# Patient Record
Sex: Male | Born: 1965 | Race: White | Hispanic: No | Marital: Married | Smoking: Former smoker
Health system: Southern US, Community
[De-identification: ages and names within clinical notes are randomized; demographics above are authoritative.]

## PROBLEM LIST (undated history)

## (undated) DIAGNOSIS — Z9889 Other specified postprocedural states: Secondary | ICD-10-CM

## (undated) DIAGNOSIS — M1990 Unspecified osteoarthritis, unspecified site: Secondary | ICD-10-CM

## (undated) DIAGNOSIS — R112 Nausea with vomiting, unspecified: Secondary | ICD-10-CM

## (undated) DIAGNOSIS — F419 Anxiety disorder, unspecified: Secondary | ICD-10-CM

## (undated) DIAGNOSIS — K219 Gastro-esophageal reflux disease without esophagitis: Secondary | ICD-10-CM

## (undated) DIAGNOSIS — F329 Major depressive disorder, single episode, unspecified: Secondary | ICD-10-CM

## (undated) DIAGNOSIS — F32A Depression, unspecified: Secondary | ICD-10-CM

## (undated) DIAGNOSIS — I10 Essential (primary) hypertension: Secondary | ICD-10-CM

## (undated) HISTORY — PX: NECK SURGERY: SHX720

## (undated) HISTORY — PX: HAND SURGERY: SHX662

## (undated) HISTORY — PX: BACK SURGERY: SHX140

---

## 2003-08-04 ENCOUNTER — Emergency Department (HOSPITAL_COMMUNITY): Admission: EM | Admit: 2003-08-04 | Discharge: 2003-08-05 | Payer: Self-pay | Admitting: *Deleted

## 2006-03-02 ENCOUNTER — Ambulatory Visit (HOSPITAL_COMMUNITY): Admission: RE | Admit: 2006-03-02 | Discharge: 2006-03-03 | Payer: Self-pay | Admitting: Neurological Surgery

## 2016-03-04 ENCOUNTER — Encounter (HOSPITAL_COMMUNITY): Payer: Self-pay | Admitting: Emergency Medicine

## 2016-03-04 ENCOUNTER — Emergency Department (HOSPITAL_COMMUNITY): Payer: Medicare Other

## 2016-03-04 ENCOUNTER — Emergency Department (HOSPITAL_COMMUNITY)
Admission: EM | Admit: 2016-03-04 | Discharge: 2016-03-04 | Disposition: A | Discharge status: Home / Self Care | Payer: Medicare Other | Attending: Emergency Medicine | Admitting: Emergency Medicine

## 2016-03-04 DIAGNOSIS — Z79899 Other long term (current) drug therapy: Secondary | ICD-10-CM | POA: Diagnosis not present

## 2016-03-04 DIAGNOSIS — G8929 Other chronic pain: Secondary | ICD-10-CM | POA: Diagnosis not present

## 2016-03-04 DIAGNOSIS — M545 Low back pain: Secondary | ICD-10-CM | POA: Insufficient documentation

## 2016-03-04 DIAGNOSIS — M549 Dorsalgia, unspecified: Secondary | ICD-10-CM

## 2016-03-04 MED ORDER — FENTANYL CITRATE (PF) 100 MCG/2ML IJ SOLN
100.0000 ug | Freq: Once | INTRAMUSCULAR | Status: AC
  Administered 2016-03-04: 100 ug via INTRAMUSCULAR
  Filled 2016-03-04: qty 2

## 2016-03-04 MED ORDER — METHOCARBAMOL 500 MG PO TABS: 1000.0000 mg | ORAL_TABLET | Freq: Four times a day (QID) | ORAL | Status: DC | PRN

## 2016-03-04 MED ORDER — PREDNISONE 20 MG PO TABS: 40.0000 mg | ORAL_TABLET | Freq: Every day | ORAL | Status: DC

## 2016-03-04 MED ORDER — METHOCARBAMOL 500 MG PO TABS
1000.0000 mg | ORAL_TABLET | Freq: Once | ORAL | Status: AC
  Administered 2016-03-04: 1000 mg via ORAL
  Filled 2016-03-04: qty 2

## 2016-03-04 NOTE — ED Notes (Signed)
Pt states that he was seen at [facility] ED for same symptoms on Wednesday.  Pt states there were no x-rays or scans taken and is concerned that "something is wrong".

## 2016-03-04 NOTE — ED Provider Notes (Signed)
CSN: [id number]     Arrival date & time 03/04/16  1439 History   First MD Initiated Contact with Patient 03/04/16 1507     Chief Complaint  Patient presents with  . Back Pain      HPI  Pt was seen at 1510. Per pt, c/o gradual onset and persistence of constant acute flair of his chronic low back "pain" for the past several months, worse over the past 2+ weeks. Describes his LBP as "shooting" down his left leg. Denies any change in his usual chronic pain pattern.  Pain worsens with palpation of the area and body position changes. States he has been evaluated by his PMD x2 as well as ED in [place] for this complaint and "no one is doing anything." States he was given IM toradol and PO oxycodone and valium without change in his symptoms. Endorses Neurosurgical MD appt next week. Denies incont/retention of bowel or bladder, no saddle anesthesia, no focal motor weakness, no tingling/numbness in extremities, no fevers, no injury, no abd pain.  The symptoms have been associated with no other complaints. The patient has a significant history of similar symptoms previously.     History reviewed. No pertinent past medical history.   Past Surgical History  Procedure Laterality Date  . Back surgery    . Neck surgery    . Hand surgery      Social History  Substance Use Topics  . Smoking status: Never Smoker   . Smokeless tobacco: Current User  . Alcohol Use: No    Review of Systems ROS: Statement: All systems negative except as marked or noted in the HPI; Constitutional: Negative for fever and chills. ; ; Eyes: Negative for eye pain, redness and discharge. ; ; ENMT: Negative for ear pain, hoarseness, nasal congestion, sinus pressure and sore throat. ; ; Cardiovascular: Negative for chest pain, palpitations, diaphoresis, dyspnea and peripheral edema. ; ; Respiratory: Negative for cough, wheezing and stridor. ; ; Gastrointestinal: Negative for nausea, vomiting, diarrhea, abdominal pain, blood in  stool, hematemesis, jaundice and rectal bleeding. . ; ; Genitourinary: Negative for dysuria, flank pain and hematuria. ; ; Musculoskeletal: +LBP. Negative for neck pain. Negative for swelling and trauma.; ; Skin: Negative for pruritus, rash, abrasions, blisters, bruising and skin lesion.; ; Neuro: Negative for headache, lightheadedness and neck stiffness. Negative for weakness, altered level of consciousness, altered mental status, extremity weakness, paresthesias, involuntary movement, seizure and syncope.      Allergies  Review of patient's allergies indicates no known allergies.  Home Medications   Prior to Admission medications   Medication Sig Start Date End Date Taking? Authorizing Provider  diazepam (VALIUM) 5 MG tablet Take 5 mg by mouth 2 (two) times daily.   Yes Historical Provider, MD  methocarbamol (ROBAXIN) 500 MG tablet Take 500 mg by mouth every 12 (twelve) hours as needed for muscle spasms.   Yes Historical Provider, MD  omeprazole (PRILOSEC) 20 MG capsule Take 40 mg by mouth daily.   Yes Historical Provider, MD  Oxycodone HCl 10 MG TABS Take 10 mg by mouth 3 (three) times daily.   Yes Historical Provider, MD   BP 159/88 mmHg  Pulse 96  Temp(Src) 98.2 F (36.8 C) (Oral)  Resp 18  Ht  (1.753 m)  Wt 194 lb (87.998 kg)  BMI 28.64 kg/m2  SpO2 100% Physical Exam  1515: Physical examination:  Nursing notes reviewed; Vital signs and O2 SAT reviewed;  Constitutional: Well developed, Well nourished, Well hydrated, In  no acute distress; Head:  Normocephalic, atraumatic; Eyes: EOMI, PERRL, No scleral icterus; ENMT: Mouth and pharynx normal, Mucous membranes moist; Neck: Supple, Full range of motion, No lymphadenopathy; Cardiovascular: Regular rate and rhythm, No gallop; Respiratory: Breath sounds clear & equal bilaterally, No wheezes.  Speaking full sentences with ease, Normal respiratory effort/excursion; Chest: Nontender, Movement normal; Abdomen: Soft, Nontender, Nondistended,  Normal bowel sounds; Genitourinary: No CVA tenderness; Spine:  No midline CS, TS, LS tenderness. +TTP left lumbar paraspinal muscles.;; Extremities: Pulses normal, No tenderness, No edema, No calf edema or asymmetry.; Neuro: AA&Ox3, Major CN grossly intact.  Speech clear. No gross focal motor or sensory deficits in extremities. Strength 5/5 equal bilat UE's and LE's, including great toe dorsiflexion.  DTR 2/4 equal bilat UE's and LE's.  No gross sensory deficits.  Neg straight leg raises bilat.; Skin: Color normal, Warm, Dry.   ED Course  Procedures (including critical care time) Labs Review  Imaging Review  I have personally reviewed and evaluated these images and lab results as part of my medical decision-making.   EKG Interpretation None      MDM  MDM Reviewed: previous chart, nursing note and vitals Interpretation: CT scan     Ct Lumbar Spine Wo Contrast 03/04/2016  CLINICAL DATA:  Low back pain extending into the left lower extremity. Personal history of lumbar spine surgery 2012. EXAM: CT LUMBAR SPINE WITHOUT CONTRAST TECHNIQUE: Multidetector CT imaging of the lumbar spine was performed without intravenous contrast administration. Multiplanar CT image reconstructions were also generated. COMPARISON:  None. FINDINGS: Lumbar spine is imaged from the midbody of T12 through the midbody of S3. Superior endplate Schmorl's nodes present at L3 and L4. Vertebral body heights are otherwise maintained. PLIF is present at L5-S1. There is solid anterior and posterior fusion. Pedicle screw and rod fixation is in place. Grade 1 anterolisthesis is present at the adjacent level L4-5 measuring 4 mm. AP alignment is otherwise anatomic. Mild leftward curvature of the lumbar spine is centered at L3-4. Limited imaging of the abdomen is unremarkable. There is no significant adenopathy. T12-L1: Mild facet hypertrophy is present bilaterally without significant stenosis. L1-2: Lateral disc bulging and endplate  changes are present. Mild facet hypertrophy is noted bilaterally. There is no significant stenosis. L2-3: A right paramedian disc protrusion extends into the right subarticular space with moderate stenosis. The left central canal is patent. The foramina are patent bilaterally. Facet hypertrophy contributes. L3-4: A mild broad-based disc protrusion and bilateral facet hypertrophy is present without significant stenosis. L4-5: There is uncovering of a broad-based disc protrusion associated with 4 mm anterolisthesis. Advanced facet hypertrophy is noted bilaterally. This results in moderate subarticular narrowing bilaterally, left greater than right. Moderate left and mild right foraminal stenosis is present. L5-S1: A wide laminectomy is present. The central canal and foramina are patent bilaterally. IMPRESSION: 1. Solid fusion at L5-S1 without residual or recurrent stenosis. 2. Adjacent level disease with advanced facet hypertrophy and 4 mm anterolisthesis. There is uncovering of a broad-based disc protrusion with moderate subarticular stenosis, left greater than right. 3. Moderate left and mild right foraminal stenosis at L4-5. 4. Right paramedian disc protrusion at L2-3 with moderate right subarticular stenosis. 5. Spondylosis at L1-2 and L3-4 without significant stenosis at these levels. Electronically Signed   By: Marin Robertshristopher  Mattern M.D.   On: 03/04/2016 16:35    1710:  CT scan reassuring regarding no acute surgical process. [facility] Dr. Wynetta Emeryram, case discussed, including:  HPI, pertinent PM/SHx, VS/PE, dx testing,  ED course and treatment:  He has viewed the images, agrees no acute surgical process at this time, multiple chronic changes, tx symptomatically, f/u with [place] in [place]. Dx and testing, as well as d/w Neurosurgeon, d/w pt and family.  Questions answered.  Verb understanding, agreeable to d/c home with outpt f/u.   Samuel JesterKathleen Hershal Eriksson, DO 03/06/16 1504

## 2016-03-04 NOTE — Discharge Instructions (Signed)
Take the prescriptions as directed.  Apply moist heat or ice to the area(s) of discomfort, for 15 minutes at a time, several times per day for the next few days.  Do not fall asleep on a heating or ice pack.  Call your regular medical doctor and your Neurosurgeon on Monday to schedule a follow up appointment next week.  Return to the Emergency Department immediately if worsening.

## 2016-03-04 NOTE — ED Notes (Signed)
History of back surgeries.  Bent out about two weeks ago and started with back pain.  Rates pain 7/10.

## 2016-10-16 ENCOUNTER — Emergency Department (HOSPITAL_COMMUNITY): Payer: Medicare Other

## 2016-10-16 ENCOUNTER — Emergency Department (HOSPITAL_COMMUNITY)
Admission: EM | Admit: 2016-10-16 | Discharge: 2016-10-16 | Disposition: A | Discharge status: Home / Self Care | Payer: Medicare Other | Attending: Emergency Medicine | Admitting: Emergency Medicine

## 2016-10-16 ENCOUNTER — Encounter (HOSPITAL_COMMUNITY): Payer: Self-pay | Admitting: Emergency Medicine

## 2016-10-16 DIAGNOSIS — R1010 Upper abdominal pain, unspecified: Secondary | ICD-10-CM | POA: Insufficient documentation

## 2016-10-16 DIAGNOSIS — F17220 Nicotine dependence, chewing tobacco, uncomplicated: Secondary | ICD-10-CM | POA: Diagnosis not present

## 2016-10-16 DIAGNOSIS — R112 Nausea with vomiting, unspecified: Secondary | ICD-10-CM | POA: Insufficient documentation

## 2016-10-16 DIAGNOSIS — R6883 Chills (without fever): Secondary | ICD-10-CM | POA: Insufficient documentation

## 2016-10-16 DIAGNOSIS — R197 Diarrhea, unspecified: Secondary | ICD-10-CM | POA: Diagnosis not present

## 2016-10-16 DIAGNOSIS — Z79899 Other long term (current) drug therapy: Secondary | ICD-10-CM | POA: Diagnosis not present

## 2016-10-16 LAB — COMPREHENSIVE METABOLIC PANEL
ALBUMIN: 4.9 g/dL (ref 3.5–5.0)
ALK PHOS: 66 U/L (ref 38–126)
ALT: 32 U/L (ref 17–63)
AST: 32 U/L (ref 15–41)
Anion gap: 7 (ref 5–15)
BILIRUBIN TOTAL: 0.6 mg/dL (ref 0.3–1.2)
BUN: 14 mg/dL (ref 6–20)
CALCIUM: 10.2 mg/dL (ref 8.9–10.3)
CO2: 28 mmol/L (ref 22–32)
CREATININE: 0.98 mg/dL (ref 0.61–1.24)
Chloride: 101 mmol/L (ref 101–111)
GFR calc Af Amer: >60 mL/min (ref >60)
GFR calc non Af Amer: >60 mL/min (ref >60)
GLUCOSE: 103 mg/dL — AB (ref 65–99)
POTASSIUM: 3.6 mmol/L (ref 3.5–5.1)
Sodium: 136 mmol/L (ref 135–145)
TOTAL PROTEIN: 8.3 g/dL — AB (ref 6.5–8.1)

## 2016-10-16 LAB — CBC
HEMATOCRIT: 45.7 % (ref 39.0–52.0)
Hemoglobin: 15.9 g/dL (ref 13.0–17.0)
MCH: 30.3 pg (ref 26.0–34.0)
MCHC: 34.8 g/dL (ref 30.0–36.0)
MCV: 87.2 fL (ref 78.0–100.0)
PLATELETS: 251 10*3/uL (ref 150–400)
RBC: 5.24 MIL/uL (ref 4.22–5.81)
RDW: 13 % (ref 11.5–15.5)
WBC: 8.5 10*3/uL (ref 4.0–10.5)

## 2016-10-16 LAB — URINALYSIS, ROUTINE W REFLEX MICROSCOPIC
BACTERIA UA: NONE SEEN
Bilirubin Urine: NEGATIVE
GLUCOSE, UA: NEGATIVE mg/dL
KETONES UR: NEGATIVE mg/dL
Leukocytes, UA: NEGATIVE
NITRITE: NEGATIVE
PH: 6 (ref 5.0–8.0)
Protein, ur: NEGATIVE mg/dL
Specific Gravity, Urine: 1.006 (ref 1.005–1.030)

## 2016-10-16 LAB — LIPASE, BLOOD: Lipase: 14 U/L (ref 11–51)

## 2016-10-16 MED ORDER — SODIUM CHLORIDE 0.9 % IV BOLUS (SEPSIS)
1000.0000 mL | Freq: Once | INTRAVENOUS | Status: AC
  Administered 2016-10-16: 1000 mL via INTRAVENOUS

## 2016-10-16 MED ORDER — IOPAMIDOL (ISOVUE-300) INJECTION 61%
100.0000 mL | Freq: Once | INTRAVENOUS | Status: AC | PRN
  Administered 2016-10-16: 100 mL via INTRAVENOUS

## 2016-10-16 MED ORDER — ONDANSETRON HCL 4 MG/2ML IJ SOLN
4.0000 mg | Freq: Once | INTRAMUSCULAR | Status: AC
Start: 2016-10-16 — End: 2016-10-16
  Administered 2016-10-16: 4 mg via INTRAVENOUS
  Filled 2016-10-16: qty 2

## 2016-10-16 MED ORDER — IOPAMIDOL (ISOVUE-300) INJECTION 61%
30.0000 mL | Freq: Once | INTRAVENOUS | Status: AC | PRN
  Administered 2016-10-16: 30 mL via ORAL

## 2016-10-16 NOTE — ED Notes (Signed)
Pt reports he is currently being tx for a "testicle infection" for several weeks. Recent antibx change, now having abd pain and D/. D/ x 4 today, denies V/, has been taking prescription medication for N/.

## 2016-10-16 NOTE — ED Triage Notes (Signed)
Patient complains of nausea, vomiting and diarrhea that started 2 weeks ago. States abdominal pain center lower abdomin.

## 2016-10-16 NOTE — ED Provider Notes (Signed)
[facility] DEPT Provider Note   CSN: [id number] Arrival date & time: 10/16/16  4098   By signing my name below, I, Bobbie Stack, attest that this documentation has been prepared under the direction and in the presence of Benjiman Core, MD. Electronically Signed: Bobbie Stack, Scribe. 10/16/16. 12:02 PM. History   Chief Complaint Chief Complaint  Patient presents with  . Nausea  . Emesis  . Diarrhea    The history is provided by the patient. No language interpreter was used.   HPI Comments: Dakota Snow is a 51 y.o. male who presents to the Emergency Department complaining of worsening upper abdominal pain that began around 2 weeks ago. He also reports associated diarrhea and chills. He reports diarrhea x4 today. He reports blood in stool 3 days in a row starting 10/11/2016. His first stool was noted to be normal with blood noted. His second stool was watery with blood noted. The patient also states that his appetite has been decreased recently. He states that his pain is sometimes worsened by food and is sometimes improved by food. He denies any fevers. The patient states that he has been on antibiotics for the past 2 weeks for a testicular infection. He was first placed on Cipro and was then switched to Levaquin by his Urologist. He has had similar infections in the past.   History reviewed. No pertinent past medical history.  There are no active problems to display for this patient.   Past Surgical History:  Procedure Laterality Date  . BACK SURGERY    . HAND SURGERY    . NECK SURGERY         Home Medications    Prior to Admission medications   Medication Sig Start Date End Date Taking? Authorizing Provider  diazepam (VALIUM) 5 MG tablet Take 5 mg by mouth 2 (two) times daily.   Yes Historical Provider, MD  levofloxacin (LEVAQUIN) 500 MG tablet Take 500 mg by mouth daily.  10/12/16  Yes Historical Provider, MD  omeprazole (PRILOSEC) 20 MG capsule Take 40 mg  by mouth daily.   Yes Historical Provider, MD  Oxycodone HCl 10 MG TABS Take 10 mg by mouth 3 (three) times daily as needed (pain).    Yes Historical Provider, MD  promethazine (PHENERGAN) 25 MG tablet Take 25 mg by mouth every 6 (six) hours as needed.  10/12/16  Yes Historical Provider, MD    Family History No family history on file.  Social History Social History  Substance Use Topics  . Smoking status: Never Smoker  . Smokeless tobacco: Current User    Types: Chew  . Alcohol use No     Allergies   Patient has no known allergies.   Review of Systems Review of Systems  Constitutional: Positive for appetite change (Decreased) and chills. Negative for fever.  Gastrointestinal: Positive for abdominal pain and diarrhea.  All other systems reviewed and are negative.    Physical Exam Updated Vital Signs BP 145/81   Pulse 77   Temp 98.1 F (36.7 C) (Oral)   Resp 17   Ht 5\' 8"  (1.727 m)   Wt 192 lb (87.1 kg)   SpO2 100%   BMI 29.19 kg/m   Physical Exam  Constitutional: He is oriented to person, place, and time. He appears well-developed and well-nourished. No distress.  HENT:  Head: Normocephalic and atraumatic.  Right Ear: External ear normal.  Left Ear: External ear normal.  Nose: Nose normal.  Mouth/Throat: Oropharynx is clear and  moist. No oropharyngeal exudate.  Eyes: Conjunctivae and EOM are normal. Pupils are equal, round, and reactive to light.  Neck: Normal range of motion. Neck supple.  Cardiovascular: Normal rate and regular rhythm.   Pulmonary/Chest: Effort normal and breath sounds normal. No stridor. No respiratory distress.  Lungs clear.  Abdominal: There is tenderness. There is no rebound and no guarding.  Mild upper quadrant tenderness. No rebounds and guarding.  Musculoskeletal: He exhibits no edema.  Neurological: He is alert and oriented to person, place, and time. No cranial nerve deficit.  Skin: Skin is warm. No rash noted. He is not  diaphoretic. No erythema.  Nursing note and vitals reviewed.    ED Treatments / Results  DIAGNOSTIC STUDIES: Oxygen Saturation is 100% on RA, normal by my interpretation.  COORDINATION OF CARE: 11:35 AM Discussed treatment plan with pt at bedside, which includes CT a/p and labs, and pt agreed to plan.  Labs (all labs ordered are listed, but only abnormal results are displayed) Labs Reviewed  COMPREHENSIVE METABOLIC PANEL - Abnormal; Notable for the following:       Result Value   Glucose, Bld 103 (*)    Total Protein 8.3 (*)    All other components within normal limits  URINALYSIS, ROUTINE W REFLEX MICROSCOPIC - Abnormal; Notable for the following:    Color, Urine STRAW (*)    Hgb urine dipstick MODERATE (*)    All other components within normal limits  LIPASE, BLOOD  CBC    EKG  EKG Interpretation None       Radiology Ct Abdomen Pelvis W Contrast  Result Date: 10/16/2016 CLINICAL DATA:  Left lower quadrant pain, nausea, vomiting, diarrhea EXAM: CT ABDOMEN AND PELVIS WITH CONTRAST TECHNIQUE: Multidetector CT imaging of the abdomen and pelvis was performed using the standard protocol following bolus administration of intravenous contrast. CONTRAST:  30mL ISOVUE-300 IOPAMIDOL (ISOVUE-300) INJECTION 61%, ISOVUE-300 IOPAMIDOL (ISOVUE-300) INJECTION 61% COMPARISON:  None. FINDINGS: Lower chest: No acute abnormality. Hepatobiliary: No focal liver abnormality is seen. No gallstones, gallbladder wall thickening, or biliary dilatation. Pancreas: Unremarkable. No pancreatic ductal dilatation or surrounding inflammatory changes. Mild pancreatic atrophy. Spleen: Normal in size without focal abnormality. Adrenals/Urinary Tract: Adrenal glands are unremarkable. Kidneys are normal, without renal calculi, focal lesion, or hydronephrosis. Bladder is unremarkable. Stomach/Bowel: Stomach is within normal limits. Appendix appears normal. No evidence of bowel wall thickening, distention, or  inflammatory changes. No pneumatosis, pneumoperitoneum or portal venous gas. Vascular/Lymphatic: No significant vascular findings are present. No enlarged abdominal or pelvic lymph nodes. Reproductive: Prostate is unremarkable. Other: No abdominal wall hernia or abnormality. No abdominopelvic ascites. Musculoskeletal: No acute osseous abnormality. No lytic or sclerotic osseous lesion. Posterior spinal interbody fusion at L5-S1. Grade 1 anterolisthesis of L4 on L5 secondary to severe bilateral facet arthropathy. IMPRESSION: 1. No acute abdominal or pelvic pathology. Electronically Signed   By: Elige Ko   On: 10/16/2016 13:23    Procedures Procedures (including critical care time)  Medications Ordered in ED Medications  sodium chloride 0.9 % bolus 1,000 mL (1,000 mLs Intravenous New Bag/Given 10/16/16 1152)  ondansetron (ZOFRAN) injection 4 mg (4 mg Intravenous Given 10/16/16 1152)  iopamidol (ISOVUE-300) 61 % injection 100 mL (100 mLs Intravenous Contrast Given 10/16/16 1302)  iopamidol (ISOVUE-300) 61 % injection 30 mL (30 mLs Oral Contrast Given 10/16/16 1302)     Initial Impression / Assessment and Plan / ED Course  I have reviewed the triage vital signs and the nursing notes.  Pertinent labs &  imaging results that were available during my care of the patient were reviewed by me and considered in my medical decision making (see chart for details).     Patient presents with nausea vomiting diarrhea and abdominal pain. Has been going for a couple weeks. Also lower abdominal pain. Is being treated with Levaquin for epididymitis and prostatitis. Reportedly has been having numerous issues with his belly. Has seen GI and hasn't seen urology. Reportedly wanted to CT scan.No dysuria. Has had GI issues for a while. Patient's wife later came and is questioning the diagnosis of prostatitis. Would like to see different urologist. She thinks also this may have to do with the son's suicide a year ago that  he saw. She seems very anxious. Will have follow-up with his gastroenterologist and given information for a Lyme serology in [place]. Will need to see his primary care doctor to help arrange further follow-up. Discharge home.  Final Clinical Impressions(s) / ED Diagnoses   Final diagnoses:  Nausea vomiting and diarrhea    New Prescriptions New Prescriptions   No medications on file   I personally performed the services described in this documentation, which was scribed in my presence. The recorded information has been reviewed and is accurate.      Benjiman CoreNathan Kinjal Neitzke, MD 10/16/16 [phone]

## 2017-05-04 ENCOUNTER — Other Ambulatory Visit (HOSPITAL_COMMUNITY): Payer: Self-pay | Admitting: Neurological Surgery

## 2017-05-04 ENCOUNTER — Other Ambulatory Visit: Payer: Self-pay | Admitting: Neurological Surgery

## 2017-05-04 DIAGNOSIS — M4316 Spondylolisthesis, lumbar region: Secondary | ICD-10-CM

## 2017-05-24 ENCOUNTER — Ambulatory Visit (HOSPITAL_COMMUNITY)
Admission: RE | Admit: 2017-05-24 | Discharge: 2017-05-24 | Disposition: A | Discharge status: Home / Self Care | Payer: Medicare Other | Source: Ambulatory Visit | Attending: Neurological Surgery | Admitting: Neurological Surgery

## 2017-05-24 DIAGNOSIS — M4316 Spondylolisthesis, lumbar region: Secondary | ICD-10-CM | POA: Diagnosis present

## 2017-05-24 DIAGNOSIS — M5116 Intervertebral disc disorders with radiculopathy, lumbar region: Secondary | ICD-10-CM | POA: Diagnosis not present

## 2017-05-24 DIAGNOSIS — M48061 Spinal stenosis, lumbar region without neurogenic claudication: Secondary | ICD-10-CM | POA: Insufficient documentation

## 2017-05-24 MED ORDER — HYDROCODONE-ACETAMINOPHEN 5-325 MG PO TABS: 1.0000 | ORAL_TABLET | ORAL | Status: DC | PRN

## 2017-05-24 MED ORDER — LIDOCAINE HCL (PF) 1 % IJ SOLN
5.0000 mL | Freq: Once | INTRAMUSCULAR | Status: AC
  Administered 2017-05-24: 2 mL via INTRADERMAL

## 2017-05-24 MED ORDER — ONDANSETRON HCL 4 MG/2ML IJ SOLN: 4.0000 mg | Freq: Four times a day (QID) | INTRAMUSCULAR | Status: DC | PRN

## 2017-05-24 MED ORDER — DIAZEPAM 5 MG PO TABS
10.0000 mg | ORAL_TABLET | Freq: Once | ORAL | Status: AC
  Administered 2017-05-24: 10 mg via ORAL

## 2017-05-24 MED ORDER — IOPAMIDOL (ISOVUE-M 200) INJECTION 41%
20.0000 mL | Freq: Once | INTRAMUSCULAR | Status: AC
  Administered 2017-05-24: 12 mL via INTRATHECAL

## 2017-05-24 MED ORDER — IOPAMIDOL (ISOVUE-M 200) INJECTION 41%
INTRAMUSCULAR | Status: AC
  Administered 2017-05-24: 12 mL via INTRATHECAL
  Filled 2017-05-24: qty 10

## 2017-05-24 MED ORDER — DIAZEPAM 5 MG PO TABS
ORAL_TABLET | ORAL | Status: AC
  Filled 2017-05-24: qty 2

## 2017-05-24 MED ORDER — LIDOCAINE HCL (PF) 1 % IJ SOLN
INTRAMUSCULAR | Status: AC
  Administered 2017-05-24: 2 mL via INTRADERMAL
  Filled 2017-05-24: qty 5

## 2017-05-24 NOTE — Procedures (Signed)
The patient is a 51 year old male who had surgery in 2012 in [place]. A decompression and fusion at L5-S1. He notes he never got complete relief of his pain however now he's having worsening back pain and lower extremity pain particularly on the left lower extremity. More recently she's had worsening lightning bolt like sensation of pain in his legs that would literally causes legs to give way. An MRI demonstrates the presence of a disc herniation at L2-3 on the right side and also demonstrates that he has a spondylolisthesis at L4-5 with marked facet joint hypertrophy. He is now being evaluated by a myelogram and post myelogram CAT scan for better identification of any path anatomy in the lower lumbar spine may be aggravating his symptoms.  Pre op Dx: Spondylolisthesis L4-L5, lumbar radiculopathy Post op Dx: Same Procedure: Lumbar myelogram Surgeon: Izik Bingman Puncture level: L2-3 Fluid color: Clear colorless Injection: Isovue-200, 12 mL Findings: Moderate spondylolisthesis L4-L5 moderate spondylosis L3-4 and L2-3. Further evaluation with CT scanning

## 2017-05-24 NOTE — Discharge Instructions (Signed)
Myelogram, Care After °These instructions give you information about caring for yourself after your procedure. Your doctor may also give you more specific instructions. Call your doctor if you have any problems or questions after your procedure. °Follow these instructions at home: °· Drink enough fluid to keep your pee (urine) clear or pale yellow. °· Rest as told by your doctor. °· Lie flat with your head slightly raised (elevated). °· Do not bend, lift, or do any hard activities for 24-48 hours or as told by your doctor. °· Take over-the-counter and prescription medicines only as told by your doctor. °· Take care of and remove your bandage (dressing) as told by your doctor. °· Bathe or shower as told by your doctor. °Contact a health care provider if: °· You have a fever. °· You have a headache that lasts longer than 24 hours. °· You feel sick to your stomach (nauseous). °· You throw up (vomit). °· Your neck is stiff. °· Your legs feel numb. °· You cannot pee. °· You cannot poop (have a bowel movement). °· You have a rash. °· You are itchy or sneezing. °Get help right away if: °· You have new symptoms or your symptoms get worse. °· You have a seizure. °· You have trouble breathing. °This information is not intended to replace advice given to you by your health care provider. Make sure you discuss any questions you have with your health care provider. °Document Released: 05/31/2008 Document Revised: 04/21/2016 Document Reviewed: 06/04/2015 °Elsevier Interactive Patient Education © 2018 [employer] ° °

## 2017-07-03 ENCOUNTER — Other Ambulatory Visit: Payer: Self-pay | Admitting: Neurological Surgery

## 2017-09-22 ENCOUNTER — Inpatient Hospital Stay (HOSPITAL_COMMUNITY): Admission: RE | Admit: 2017-09-22 | Payer: Medicare Other | Source: Ambulatory Visit | Admitting: Neurological Surgery

## 2017-09-22 ENCOUNTER — Encounter (HOSPITAL_COMMUNITY): Admission: RE | Payer: Self-pay | Source: Ambulatory Visit

## 2017-09-22 SURGERY — POSTERIOR LUMBAR FUSION 1 WITH HARDWARE REMOVAL
Anesthesia: General | Site: Back

## 2017-10-25 ENCOUNTER — Other Ambulatory Visit: Payer: Self-pay | Admitting: Neurological Surgery

## 2017-10-26 ENCOUNTER — Other Ambulatory Visit: Payer: Self-pay | Admitting: Neurological Surgery

## 2017-10-26 DIAGNOSIS — M48062 Spinal stenosis, lumbar region with neurogenic claudication: Secondary | ICD-10-CM

## 2017-11-06 ENCOUNTER — Other Ambulatory Visit (HOSPITAL_COMMUNITY): Payer: Self-pay | Admitting: Neurological Surgery

## 2017-11-06 DIAGNOSIS — M48062 Spinal stenosis, lumbar region with neurogenic claudication: Secondary | ICD-10-CM

## 2017-11-17 NOTE — Pre-Procedure Instructions (Signed)
Dakota Snow  11/17/2017      [facility] [id number] [place] - [address] AT [facility] [address] [facility] [place] [id number] Phone: [phone] Fax: [phone]    Your procedure is scheduled on Mon., November 27, 2017  [facility] Admitting [facility]trance "A" at 10:30AM   Call this number if you have problems the morning of surgery:  [phone]   Remember:  Do not eat food or drink liquids after midnight.  Take these medicines the morning of surgery with A SIP OF WATER: Fluticasone (FLONASE). If needed Oxycodone for pain, Ondansetron (ZOFRAN) for nausea, and Diazepam (VALIUM) for anxiety or spasms.  7 days before surgery (Mar. 180), stop taking all Aspirins, Vitamins, Fish oils, and Herbal medications. Also stop all NSAIDS i.e. Advil, Ibuprofen, Motrin, Aleve, Anaprox, Naproxen, BC and Goody Powders.   Do not wear jewelry.  Do not wear lotions, powders, colognes, or deodorant.  Do not shave 48 hours prior to surgery.  Men may shave face and neck.  Do not bring valuables to the hospital.  [facility] is not responsible for any belongings or valuables.  Contacts, dentures or bridgework may not be worn into surgery.  Leave your suitcase in the car.  After surgery it may be brought to your room.  For patients admitted to the hospital, discharge time will be determined by your treatment team.  Patients discharged the day of surgery will not be allowed to drive home.     Special instructions:   [place]- Preparing For Surgery  Before surgery, you can play an important role. Because skin is not sterile, your skin needs to be as free of germs as possible. You can reduce the number of germs on your skin by washing with CHG (chlorahexidine gluconate) Soap before surgery.  CHG is an antiseptic cleaner which kills germs and bonds with the skin to continue killing germs even after washing.  Please do not use if you  have an allergy to CHG or antibacterial soaps. If your skin becomes reddened/irritated stop using the CHG.  Do not shave (including legs and underarms) for at least 48 hours prior to first CHG shower. It is OK to shave your face.  Please follow these instructions carefully.   1. Shower the NIGHT BEFORE SURGERY and the MORNING OF SURGERY with CHG.   2. If you chose to wash your hair, wash your hair first as usual with your normal shampoo.  3. After you shampoo, rinse your hair and body thoroughly to remove the shampoo.  4. Use CHG as you would any other liquid soap. You can apply CHG directly to the skin and wash gently with a scrungie or a clean washcloth.   5. Apply the CHG Soap to your body ONLY FROM THE NECK DOWN.  Do not use on open wounds or open sores. Avoid contact with your eyes, ears, mouth and genitals (private parts). Wash Face and genitals (private parts)  with your normal soap.  6. Wash thoroughly, paying special attention to the area where your surgery will be performed.  7. Thoroughly rinse your body with warm water from the neck down.  8. DO NOT shower/wash with your normal soap after using and rinsing off the CHG Soap.  9. Pat yourself dry with a CLEAN TOWEL.  10. Wear CLEAN PAJAMAS to bed the night before surgery, wear comfortable clothes the morning of surgery  11. Place CLEAN SHEETS on your bed the night of your first shower and DO NOT SLEEP WITH PETS.  Day of Surgery: Do not apply any deodorants/lotions. Please wear clean clothes to the hospital/surgery center.    Please read over the following fact sheets that you were given. Pain Booklet, Coughing and Deep Breathing, MRSA Information and Surgical Site Infection Prevention

## 2017-11-20 ENCOUNTER — Encounter (HOSPITAL_COMMUNITY)
Admission: RE | Admit: 2017-11-20 | Discharge: 2017-11-20 | Disposition: A | Discharge status: Home / Self Care | Payer: Medicare Other | Source: Ambulatory Visit | Attending: Neurological Surgery | Admitting: Neurological Surgery

## 2017-11-20 ENCOUNTER — Encounter (HOSPITAL_COMMUNITY): Payer: Self-pay

## 2017-11-20 ENCOUNTER — Ambulatory Visit (HOSPITAL_COMMUNITY)
Admission: RE | Admit: 2017-11-20 | Discharge: 2017-11-20 | Disposition: A | Discharge status: Home / Self Care | Payer: Medicare Other | Source: Ambulatory Visit | Attending: Neurological Surgery | Admitting: Neurological Surgery

## 2017-11-20 DIAGNOSIS — I10 Essential (primary) hypertension: Secondary | ICD-10-CM | POA: Diagnosis not present

## 2017-11-20 DIAGNOSIS — Z01810 Encounter for preprocedural cardiovascular examination: Secondary | ICD-10-CM | POA: Diagnosis present

## 2017-11-20 DIAGNOSIS — M4327 Fusion of spine, lumbosacral region: Secondary | ICD-10-CM | POA: Insufficient documentation

## 2017-11-20 DIAGNOSIS — Z01812 Encounter for preprocedural laboratory examination: Secondary | ICD-10-CM | POA: Diagnosis present

## 2017-11-20 DIAGNOSIS — M48062 Spinal stenosis, lumbar region with neurogenic claudication: Secondary | ICD-10-CM

## 2017-11-20 DIAGNOSIS — M4316 Spondylolisthesis, lumbar region: Secondary | ICD-10-CM | POA: Diagnosis not present

## 2017-11-20 HISTORY — DX: Unspecified osteoarthritis, unspecified site: M19.90

## 2017-11-20 HISTORY — DX: Depression, unspecified: F32.A

## 2017-11-20 HISTORY — DX: Essential (primary) hypertension: I10

## 2017-11-20 HISTORY — DX: Major depressive disorder, single episode, unspecified: F32.9

## 2017-11-20 HISTORY — DX: Anxiety disorder, unspecified: F41.9

## 2017-11-20 LAB — TYPE AND SCREEN
ABO/RH(D): A POS
ANTIBODY SCREEN: NEGATIVE

## 2017-11-20 LAB — BASIC METABOLIC PANEL
ANION GAP: 9 (ref 5–15)
BUN: 7 mg/dL (ref 6–20)
CALCIUM: 9.4 mg/dL (ref 8.9–10.3)
CO2: 24 mmol/L (ref 22–32)
Chloride: 106 mmol/L (ref 101–111)
Creatinine, Ser: 0.92 mg/dL (ref 0.61–1.24)
GFR calc Af Amer: >60 mL/min (ref >60)
GFR calc non Af Amer: >60 mL/min (ref >60)
GLUCOSE: 114 mg/dL — AB (ref 65–99)
Potassium: 4.1 mmol/L (ref 3.5–5.1)
Sodium: 139 mmol/L (ref 135–145)

## 2017-11-20 LAB — CBC
HCT: 43 % (ref 39.0–52.0)
Hemoglobin: 14.9 g/dL (ref 13.0–17.0)
MCH: 30.8 pg (ref 26.0–34.0)
MCHC: 34.7 g/dL (ref 30.0–36.0)
MCV: 88.8 fL (ref 78.0–100.0)
Platelets: 241 10*3/uL (ref 150–400)
RBC: 4.84 MIL/uL (ref 4.22–5.81)
RDW: 13.5 % (ref 11.5–15.5)
WBC: 6.3 10*3/uL (ref 4.0–10.5)

## 2017-11-20 LAB — SURGICAL PCR SCREEN
MRSA, PCR: NEGATIVE
Staphylococcus aureus: NEGATIVE

## 2017-11-20 LAB — ABO/RH: ABO/RH(D): A POS

## 2017-11-27 ENCOUNTER — Encounter (HOSPITAL_COMMUNITY): Admission: RE | Payer: Self-pay | Source: Ambulatory Visit

## 2017-11-27 ENCOUNTER — Inpatient Hospital Stay (HOSPITAL_COMMUNITY): Admission: RE | Admit: 2017-11-27 | Payer: Medicare Other | Source: Ambulatory Visit | Admitting: Neurological Surgery

## 2017-11-27 SURGERY — POSTERIOR LUMBAR FUSION 1 WITH HARDWARE REMOVAL
Anesthesia: General | Site: Back

## 2017-12-19 ENCOUNTER — Other Ambulatory Visit: Payer: Self-pay | Admitting: Neurological Surgery

## 2018-02-06 ENCOUNTER — Other Ambulatory Visit (HOSPITAL_COMMUNITY): Payer: Medicare Other | Attending: Psychiatry | Admitting: Licensed Clinical Social Worker

## 2018-02-06 DIAGNOSIS — F411 Generalized anxiety disorder: Secondary | ICD-10-CM

## 2018-02-06 DIAGNOSIS — Z79899 Other long term (current) drug therapy: Secondary | ICD-10-CM | POA: Insufficient documentation

## 2018-02-06 DIAGNOSIS — F332 Major depressive disorder, recurrent severe without psychotic features: Secondary | ICD-10-CM | POA: Diagnosis present

## 2018-02-07 ENCOUNTER — Telehealth (HOSPITAL_COMMUNITY): Payer: Self-pay | Admitting: Licensed Clinical Social Worker

## 2018-02-09 NOTE — Psych (Signed)
Comprehensive Clinical Assessment (CCA) Note  02/09/2018 Dakota Snow [id number]  Visit Diagnosis:      ICD-10-CM   1. MDD (major depressive disorder), recurrent severe, without psychosis (HCC) F33.2   2. Generalized anxiety disorder F41.1       CCA Part One  Part One has been completed on paper by the patient.  (See scanned document in Chart Review)  CCA Part Two A  Intake/Chief Complaint:  CCA Intake With Chief Complaint CCA Part Two Date: 02/06/18 CCA Part Two Time: 1430 Chief Complaint/Presenting Problem: Pt presents for CCA for PHP per inpt [facility]). Pt reports he has had an increase in depression, anxiety, and SI recently. Pt states he had a period of about 8 days prior to inpt stay where he was unable to sleep due to thoughts, anxiety, and physical pain.  Pt shares that his 18yo son killed himself in front of the pt 3 years ago and still deals with the images and thoughts about this event. 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 Pt has had 3 spinal fusions and is scheduled for another in 2 weeks. Pt denies significant tx hx for mental health prior to a week at [facility]. Pt endorses panic attacks "almost weekly", isolation, sleep struggles, decrease in appetite, mood swings, and anhedonia. Pt states he was cutting himself "for a couple of months" in 2018 but has not cut in about 6-7 months. Pt reports family lives in [place] and he lacks support in the area. Pt shares he has not wanted to be medicated for mental health issues in the past but now feels it is necessary. Pt denies any HI/AVH. Pt denies SI today and is able to report he will reach out for help if thoughts return. Patients Currently Reported Symptoms/Problems: increased anxiety, depression, and SI; mood swings, increased tearfulness; decrease in appetite; hopelessness; worthlessness; anhedonia; decrease in sleep; low  energy; irritability; panic attacks; lacks concentration Collateral Involvement: Pt's wife, Misty Stanley, sat in on CCA per patient request Individual's Strengths: motivation for treatment; some insight Individual's Preferences: Pt reports "I want to get a handle on my thought process, or internal dialoge.  I also want to work through my guilt and decrease my anxiety." Type of Services Patient Feels Are Needed: Pt wants to learn ways of coping Initial Clinical Notes/Concerns: Pt shares he is going on a family beach trip next week "which I think will help my head" and is then scheduled for back surgery on 02/19/18. Pt will be unable to attend PHP at this time due to trip, surgery, and then recovery.  Mental Health Symptoms Depression:  Depression: Change in energy/activity, Difficulty Concentrating, Fatigue, Hopelessness, Irritability, Increase/decrease in appetite, Sleep (too much or little), Tearfulness, Weight gain/loss, Worthlessness  Mania:     Anxiety:   Anxiety: Difficulty concentrating, Fatigue, Irritability, Restlessness, Sleep, Tension, Worrying  Psychosis:     Trauma:     Obsessions:     Compulsions:     Inattention:     Hyperactivity/Impulsivity:     Oppositional/Defiant Behaviors:     Borderline Personality:     Other Mood/Personality Symptoms:      Mental Status Exam Appearance and self-care  Stature:  Stature: Average  Weight:  Weight: Average weight  Clothing:  Clothing: Casual  Grooming:  Grooming: Normal  Cosmetic use:  Cosmetic Use: None  Posture/gait:  Posture/Gait: Normal  Motor activity:  Motor Activity: Not Remarkable  Sensorium  Attention:  Attention: Normal  Concentration:  Concentration: Normal  Orientation:  Orientation: X5  Recall/memory:  Recall/Memory: Normal  Affect and Mood  Affect:  Affect: Anxious  Mood:  Mood: Anxious  Relating  Eye contact:  Eye Contact: Fleeting  Facial expression:  Facial Expression: Anxious  Attitude toward examiner:  Attitude  Toward Examiner: Cooperative  Thought and Language  Speech flow: Speech Flow: Normal  Thought content:     Preoccupation:  Preoccupations: Guilt  Hallucinations:     Organization:     Company secretaryxecutive Functions  Fund of Knowledge:  Fund of Knowledge: Average  Intelligence:  Intelligence: Average  Abstraction:  Abstraction: Normal  Judgement:  Judgement: Fair  Dance movement psychotherapisteality Testing:  Reality Testing: Adequate  Insight:  Insight: Fair  Decision Making:  Decision Making: Normal  Social Functioning  Social Maturity:  Social Maturity: Isolates, Responsible  Social Judgement:  Social Judgement: Normal  Stress  Stressors:  Stressors: Family conflict, Illness, Money, Grief/losses  Coping Ability:  Coping Ability: Exhausted  Skill Deficits:     Supports:      Family and Psychosocial History: Family history Marital status: Married Number of Years Married: 25 What types of issues is patient dealing with in the relationship?: financial Does patient have children?: Yes How many children?: 2 How is patient's relationship with their children?: Son: suicide 3 years ago in front of pt; daughter 115  Childhood History:  Childhood History By whom was/is the patient raised?: Both parents Description of patient's relationship with caregiver when they were a child: Pt reports father was a Administrator, Civil Servicevet and ran his household like the [employer] and Companymilitary. Pt shares father was suffering from untreated PTSD at that time. Pt shares he had a good relationship with Mom, though she is "always anxious about everything." Pt reports parents fought a lot and "I was often put in the middle" Patient's description of current relationship with people who raised him/her: Pt reports he has an "OK" relationship with both parents, though relationship with father is strained at times. Does patient have siblings?: Yes Number of Siblings: 2 Description of patient's current relationship with siblings: Pt reports he is close with middle brother; Pt shares  he is working on his relationship with youngest brother Did patient suffer any verbal/emotional/physical/sexual abuse as a child?: No Did patient suffer from severe childhood neglect?: No Has patient ever been sexually abused/assaulted/raped as an adolescent or adult?: No Was the patient ever a victim of a crime or a disaster?: No Has patient been effected by domestic violence as an adult?: No  CCA Part Two B  Employment/Work Situation: Employment / Work Situation Employment situation: On disability Why is patient on disability: back/neck pain How long has patient been on disability: since 2012 Did You Receive Any Psychiatric Treatment/Services While in the [employer]?: No Are There Guns or Other Weapons in Your Home?: No(Pt's wife reports guns were removed from the house after pt shared he had thoughts of suicide)  Education:    Religion: Religion/Spirituality Are You A Religious Person?: Yes What is Your Religious Affiliation?: Christian  Leisure/Recreation:    Exercise/Diet: Exercise/Diet Do You Exercise?: No Have You Gained or Lost A Significant Amount of Weight in the Past Six Months?: Yes-Lost Number of Pounds Lost?: 16 Do You Follow a Special Diet?: No Do You Have Any Trouble Sleeping?: Yes Explanation of Sleeping Difficulties: Pt reports racing thoughts make it difficult to fall asleep and then physical pain makes it difficult to stay asleep  CCA Part Two C  Alcohol/Drug Use: Alcohol / Drug Use Pain Medications: Oxycodone 5mg  for back Prescriptions: Oxycodone 5mg , Duloxetine DR 30mg ; Lisimipril 10/12mg ; Diazapam 2.5mg  History of alcohol / drug use?: No history of alcohol / drug abuse                      CCA Part Three  ASAM's:  Six Dimensions of Multidimensional Assessment  Dimension 1:  Acute Intoxication and/or Withdrawal Potential:     Dimension 2:  Biomedical Conditions and Complications:     Dimension 3:  Emotional, Behavioral, or Cognitive  Conditions and Complications:     Dimension 4:  Readiness to Change:     Dimension 5:  Relapse, Continued use, or Continued Problem Potential:     Dimension 6:  Recovery/Living Environment:      Substance use Disorder (SUD)    Social Function:  Social Functioning Social Maturity: Isolates, Responsible Social Judgement: Normal  Stress:  Stress Stressors: Family conflict, Illness, Money, Grief/losses Coping Ability: Exhausted Patient Takes Medications The Way The Doctor Instructed?: Yes Priority Risk: Moderate Risk  Risk Assessment- Self-Harm Potential: Risk Assessment For Self-Harm Potential Thoughts of Self-Harm: Vague current thoughts Method: No plan Additional Information for Self-Harm Potential: Acts of Self-harm, Preoccupation with Death Additional Comments for Self-Harm Potential: Pt reports he thinks about death often but does not want to harm himself. Pt reports decrease in SI since hospitalization. Pt shares he was cutting himself last year but has not in 6-7 months  Risk Assessment -Dangerous to Others Potential: Risk Assessment For Dangerous to Others Potential Method: No Plan  DSM5 Diagnoses: There are no active problems to display for this patient.   Patient Centered Plan: Patient is on the following Treatment Plan(s):  Depression  Recommendations for Services/Supports/Treatments: Recommendations for Services/Supports/Treatments Recommendations For Services/Supports/Treatments: Partial Hospitalization, Medication Management, Individual Therapy(Pt could benefit from [facility] but cannot commit due to trip, back surgery, and then recovery. Pt was provided with appt with Dr. Vanetta Shawl for 6/14 and provided with informaiton to contact [facility] for individual counseling. Pt told to call cln if needs mor)  Treatment Plan Summary:  Pt reports "I want to get a handle on my thought processes."  Referrals to Alternative Service(s): Referred to Alternative Service(s):   Place:    Date:   Time:    Referred to Alternative Service(s):   Place:   Date:   Time:    Referred to Alternative Service(s):   Place:   Date:   Time:    Referred to Alternative Service(s):   Place:   Date:   Time:     Shawn Dannenberg J Shamarcus Hoheisel, LPCA, LCASA

## 2018-02-12 ENCOUNTER — Encounter (HOSPITAL_COMMUNITY)
Admission: RE | Admit: 2018-02-12 | Discharge: 2018-02-12 | Disposition: A | Discharge status: Home / Self Care | Payer: Medicare Other | Source: Ambulatory Visit | Attending: Neurological Surgery | Admitting: Neurological Surgery

## 2018-02-12 ENCOUNTER — Other Ambulatory Visit: Payer: Self-pay

## 2018-02-12 ENCOUNTER — Encounter (HOSPITAL_COMMUNITY): Payer: Self-pay

## 2018-02-12 DIAGNOSIS — Z01812 Encounter for preprocedural laboratory examination: Secondary | ICD-10-CM | POA: Insufficient documentation

## 2018-02-12 HISTORY — DX: Other specified postprocedural states: Z98.890

## 2018-02-12 HISTORY — DX: Other specified postprocedural states: R11.2

## 2018-02-12 HISTORY — DX: Gastro-esophageal reflux disease without esophagitis: K21.9

## 2018-02-12 LAB — CBC
HEMATOCRIT: 44.8 % (ref 39.0–52.0)
Hemoglobin: 14.6 g/dL (ref 13.0–17.0)
MCH: 29.6 pg (ref 26.0–34.0)
MCHC: 32.6 g/dL (ref 30.0–36.0)
MCV: 90.7 fL (ref 78.0–100.0)
Platelets: 267 10*3/uL (ref 150–400)
RBC: 4.94 MIL/uL (ref 4.22–5.81)
RDW: 13 % (ref 11.5–15.5)
WBC: 7.5 10*3/uL (ref 4.0–10.5)

## 2018-02-12 LAB — BASIC METABOLIC PANEL
ANION GAP: 9 (ref 5–15)
BUN: 9 mg/dL (ref 6–20)
CO2: 28 mmol/L (ref 22–32)
CREATININE: 0.9 mg/dL (ref 0.61–1.24)
Calcium: 9.8 mg/dL (ref 8.9–10.3)
Chloride: 101 mmol/L (ref 101–111)
GFR calc Af Amer: >60 mL/min (ref >60)
Glucose, Bld: 94 mg/dL (ref 65–99)
Potassium: 4.4 mmol/L (ref 3.5–5.1)
Sodium: 138 mmol/L (ref 135–145)

## 2018-02-12 LAB — TYPE AND SCREEN
ABO/RH(D): A POS
ANTIBODY SCREEN: NEGATIVE

## 2018-02-12 LAB — SURGICAL PCR SCREEN
MRSA, PCR: NEGATIVE
Staphylococcus aureus: NEGATIVE

## 2018-02-12 NOTE — Pre-Procedure Instructions (Signed)
Dakota Snow  02/12/2018      [id number] - [place], [place] - [address] AT [facility] [address] [place] [address] Phone: [phone] Fax: [phone]    Your procedure is scheduled on Monday, June 17.   Report to [facility] Admitting at 5:30  A.M.             (posted surgery time 7:30a - 11:41a)                Call this number if you have problems the morning of surgery: [phone] ---the pre- op desk                 For any other questions prior to surgery Monday - Friday, 8:00 AM - 4:00 PM, call [phone]- PAT desk, ask to speak to any nurse.   Remember:  DO NOT eat any food or drink any liquids midnight, Sunday.  However, please take the following  morning of surgery with A SIP OF WATER :    DULoxetine (CYMBALTA) If needed: acetaminophen (TYLENOL)  diazepam (VALIUM) ondansetron (ZOFRAN)  Oxycodone   CONTINUE TO TAKE  EVERY DAY UNTIL THE MORNING OF SURGERY, do not take it the morning of surgery lisinopril-hydrochlorothiazide (PRINZIDE,ZESTORETIC)    4-5 days out STOP taking  Aspirin , Aspirin Products (Goody Powder, Excedrin Migraine), Ibuprofen (Advil), Naproxen (Aleve), Vitiamins and Herbal Products (ie Fish Oil)               Do not wear jewelry - no rings or watches.  Do not wear lotions, colognes or deodorant.   Men may shave face and neck.  Do not bring valuables to the hospital.  [facility] is not responsible for any belongings or valuables.  Contacts, dentures or bridgework may not be worn into surgery.  Leave your suitcase in the car.  After surgery it may be brought to your room.  For patients admitted to the hospital, discharge time will be determined by your treatment team.      [facility]- Preparing For Surgery  Before surgery, you can play an important role. Because skin is not sterile, your skin needs to be as free of germs as possible. You can reduce the number of  germs on your skin by washing with CHG (chlorahexidine gluconate) Soap before surgery.  CHG is an antiseptic cleaner which kills germs and bonds with the skin to continue killing germs even after washing.    Oral Hygiene is also important to reduce your risk of infection.  Remember - BRUSH YOUR TEETH THE MORNING OF SURGERY WITH YOUR REGULAR TOOTHPASTE  Please do not use if you have an allergy to CHG or antibacterial soaps. If your skin becomes reddened/irritated stop using the CHG.  Do not shave (including legs and underarms) for at least 48 hours prior to first CHG shower. It is OK to shave your face.  Please follow these instructions carefully.   1. Shower the NIGHT BEFORE SURGERY and the MORNING OF SURGERY with CHG.   2. If you chose to wash your hair, wash your hair first as usual with your normal shampoo.  3. After you shampoo, rinse your hair and body thoroughly to remove the shampoo.   Wash your face and private area with the soap you use at home, then rinse.  4. Use CHG as you would any other liquid soap. You can apply CHG directly to  the skin and wash gently with a scrungie or a clean washcloth.   5. Wash thoroughly, paying special attention to the area where your surgery will be performed.  6. Thoroughly rinse your body with warm water from the neck down.  7. DO NOT shower/wash with your normal soap after using and rinsing off the CHG Soap.  8. Pat yourself dry with a CLEAN TOWEL.  9. Wear CLEAN PAJAMAS to bed the night before surgery, wear comfortable clothes the morning of surgery  10. Place CLEAN SHEETS on your bed the night of your first shower and DO NOT SLEEP WITH PETS.  Day of Surgery: Shower as Above Do not apply any deodorants/lotions, powders or cologne.  Please wear clean clothes to the hospital/surgery center.   Remember to brush your teeth WITH YOUR REGULAR TOOTHPASTE.          Please read over the following fact sheets that you were given: Pain Booklet,  Patient Instructions for Mupirocin Application,   Surgical Site Infections.

## 2018-02-12 NOTE — Progress Notes (Signed)
Has no PCP.  Will go to [facility] in [place], [place] when he's ill.  Had been on HTN meds, stopped and is currently back on.  Those was prescribed by [facility] Denies any cardiac issues, tests. State he was admitted to [facility] 2 plus weeks ago for severe depression and suicidal thoughts in [place], [place]   (son had died) EKG done in March 2019.

## 2018-02-12 NOTE — Pre-Procedure Instructions (Signed)
Dakota Snow  02/12/2018      [facility] - [place], [place] - [address] AT [facility] [address] [place] [address] Phone: [phone] Fax: [phone]    Your procedure is scheduled on Monday, June 17.  Report to [facility] Admitting at 5:30  A.M.                Your surgery or procedure is scheduled for 7:30 AM   Call this number if you have problems the morning of surgery: [phone] ---the pre- op desk                For any other questions prior to surgery Monday - Friday, 8:00 AM - 4:00 PM, call [phone]- PAT desk, ask to speak to any nurse.   Remember:  No food after midnight Sunday, June 16   Take these medicines the morning of surgery with A SIP OF WATER : DULoxetine (CYMBALTA) If needed: acetaminophen (TYLENOL)  diazepam (VALIUM) ondansetron (ZOFRAN)  Oxycodone   CONTINUE TO TAKE  EVERY DAY UNTIL THE MORNING OF SURGERY, do not take it the morning of surgery lisinopril-hydrochlorothiazide (PRINZIDE,ZESTORETIC)    DO NOT Take Aspirin , Aspirin Products (Goody Powder, Excedrin Migraine), Ibuprofen (Advil), Naproxen (Aleve), Vitiamins and Herbal Products (ie Fish Oil)  Special instructions:   - Preparing For Surgery  Before surgery, you can play an important role. Because skin is not sterile, your skin needs to be as free of germs as possible. You can reduce the number of germs on your skin by washing with CHG (chlorahexidine gluconate) Soap before surgery.  CHG is an antiseptic cleaner which kills germs and bonds with the skin to continue killing germs even after washing.    Oral Hygiene is also important to reduce your risk of infection.  Remember - BRUSH YOUR TEETH THE MORNING OF SURGERY WITH YOUR REGULAR TOOTHPASTE  Please do not use if you have an allergy to CHG or antibacterial soaps. If your skin becomes reddened/irritated stop using the CHG.  Do not shave (including legs  and underarms) for at least 48 hours prior to first CHG shower. It is OK to shave your face.  Please follow these instructions carefully.   1. Shower the NIGHT BEFORE SURGERY and the MORNING OF SURGERY with CHG.   2. If you chose to wash your hair, wash your hair first as usual with your normal shampoo.  3. After you shampoo, rinse your hair and body thoroughly to remove the shampoo.   Wash your face and private area with the soap you use at home, then rinse.  4. Use CHG as you would any other liquid soap. You can apply CHG directly to the skin and wash gently with a scrungie or a clean washcloth.   5. Wash thoroughly, paying special attention to the area where your surgery will be performed.  6. Thoroughly rinse your body with warm water from the neck down.  7. DO NOT shower/wash with your normal soap after using and rinsing off the CHG Soap.  8. Pat yourself dry with a CLEAN TOWEL.  9. Wear CLEAN PAJAMAS to bed the night before surgery, wear comfortable clothes the morning of surgery  10. Place CLEAN SHEETS on your bed the night of your first shower and DO NOT SLEEP WITH PETS.  Day of Surgery: Shower as Above Do not apply any deodorants/lotions, powders or cologne.  Please wear clean clothes to the hospital/surgery center.   Remember to brush your teeth WITH YOUR REGULAR TOOTHPASTE.            Do not wear jewelry, make-up or nail polish.  Do not wear lotions, powders, or perfumes, or deodorant.  Do not shave 48 hours prior to surgery.  Men may shave face and neck.  Do not bring valuables to the hospital.  [facility] is not responsible for any belongings or valuables.  Contacts, dentures or bridgework may not be worn into surgery.  Leave your suitcase in the car.  After surgery it may be brought to your room.  For patients admitted to the hospital, discharge time will be determined by your treatment team.  Patients discharged the day of surgery will not be allowed to drive  home.   Please read over the following fact sheets that you were given: Pain Booklet, Patient Instructions for Mupirocin Application, Coughing and Deep Breathing, Surgical Site Infections.

## 2018-02-13 NOTE — Progress Notes (Signed)
Psychiatric Initial Adult Assessment   Patient Identification: Dakota Snow MRN:  [id number] Date of Evaluation:  02/16/2018 Referral Source: Delbert Harness, LCASA Chief Complaint:   Chief Complaint    Psychiatric Evaluation; Depression     Visit Diagnosis:    ICD-10-CM   1. MDD (major depressive disorder), recurrent episode, moderate (HCC) F33.1     History of Present Illness:   Dakota Snow is a 52 y.o. year old male with a history of Spondylolisthesis L4-L5, lumbar radiculopathy, who presents for follow up appointment for MDD (major depressive disorder), recurrent episode, moderate (HCC)  The patient states that he is recommended to come here by PHP.  He was admitted to [facility] in [place], [place] for six days last month. He was very depressed and had SI without plan/intent. He states that his son shot himself at home in 2016. He found the son and cleaned his face. His wife left the house immediately and his daughter ran out from the window with the thought that there was break in. Although he tried to push it away and he thought he was doing well, his mood got worse after a serials of spinal surgery. He  states that he feels guilty about the loss.  He states that he was like his father, who was a [place] combat veteran. He felt that he was in a [employer], raised by him. Although he was hoping not to do the same thing to his son, he might have done so.  He denies any abuse to his son or other. They moved from [place] to [place], and he knows that his son appeared to struggle with this transition, although his son acts joking around with people. He also states that he gave his son a service gun as a gift; his son used it for suicide. Although he used to share his feeling with his wife, he tries not to as she appears to be more overwhelmed these days. His daughter, age 74 does not say anything and he is concerned as she broke into her other's house to get alcohol. He agrees that  his family are feeling pain and tends to be defensive against others. He also agrees that he does not want to forget the death of his son while it is very painful to think about him. He has been also struggling with back pain.  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 He feels stressed as he feels like he is not contributing. He feels much better after discharge. Although he used to spend time in his son's room doing nothing, he hopes to have routine after spinal surgery.   He has occasional insomnia.  He feels fatigue.  He has fair concentration.  He denies SI.  He feels anxious and tense at times.  The last panic attacks was  right before the psychiatry admission.  He has nightmares, flashback of his son's death, although it has become less frequent.  He has hypervigilance.  He takes Valium a few times a week or less for anxiety. He occasionally drinks a glass of wine. He denies drug use. He denies side effect from duloxetine.   Per PMP,  On oxycodone, diazepam filled on 02/02/2018  Associated Signs/Symptoms: Depression Symptoms:  depressed mood, insomnia, fatigue, anxiety, panic attacks, (Hypo) Manic Symptoms:  denies decrased need for sleep, euphoria Anxiety Symptoms:  Excessive Worry, Panic Symptoms, Psychotic Symptoms:  denies AH, VH PTSD Symptoms: Had a traumatic exposure:  found her son attempted suicide at home Re-experiencing:  Flashbacks Nightmares Hypervigilance:  Yes Hyperarousal:  Emotional Numbness/Detachment Increased Startle Response Avoidance:  Decreased Interest/Participation  Past Psychiatric History:  Outpatient:  Psychiatry admission: once in 01/2018 for seven days for depression Previous suicide attempt: denies  Past trials of medication: duloxetine, valium (may have tried sertraline in the past, which made him feel weird) History of violence:   Previous Psychotropic Medications: Yes   Substance Abuse History in the last 12  months:  No.  Consequences of Substance Abuse: NA  Past Medical History:  Past Medical History:  Diagnosis Date  . Anxiety   . Arthritis   . Depression    watched son committ suicide  . GERD (gastroesophageal reflux disease)   . Hypertension    no meds  . PONV (postoperative nausea and vomiting)    HAD SOME ISSUES WITH NAUSEA.- THINKS ITS MEDICATION RELATED, NOT ANESTHESIA    Past Surgical History:  Procedure Laterality Date  . BACK SURGERY     X 2   . HAND SURGERY    . NECK SURGERY      Family Psychiatric History:  Father- depression, son completed suicide, mother- anxiety  Family History:  Family History  Problem Relation Age of Onset  . Anxiety disorder Mother   . Depression Father     Social History:   Social History   Socioeconomic History  . Marital status: Married    Spouse name: Not on file  . Number of children: Not on file  . Years of education: Not on file  . Highest education level: Not on file  Occupational History  . Not on file  Social Needs  . Financial resource strain: Not on file  . Food insecurity:    Worry: Not on file    Inability: Not on file  . Transportation needs:    Medical: Not on file    Non-medical: Not on file  Tobacco Use  . Smoking status: Never Smoker  . Smokeless tobacco: Former NeurosurgeonUser    Types: Chew  Substance and Sexual Activity  . Alcohol use: Not Currently    Comment: occ  . Drug use: Not on file    Comment: CBD OINTMENT ON BACK  . Sexual activity: Yes    Birth control/protection: Implant  Lifestyle  . Physical activity:    Days per week: Not on file    Minutes per session: Not on file  . Stress: Not on file  Relationships  . Social connections:    Talks on phone: Not on file    Gets together: Not on file    Attends religious service: Not on file    Active member of club or organization: Not on file    Attends meetings of clubs or organizations: Not on file    Relationship status: Not on file  Other  Topics Concern  . Not on file  Social History Narrative  . Not on file    Additional Social History:  He grew up [place]. He moved to [place] a few years ago. 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 On disability since 2012  Allergies:  No Known Allergies  Metabolic Disorder Labs: No results found for: HGBA1C, MPG No results found for: PROLACTIN No results found for: CHOL, TRIG, HDL, CHOLHDL, VLDL, LDLCALC  Current Medications: Current Outpatient Medications  Medication Sig Dispense Refill  . acetaminophen (TYLENOL) 500 MG tablet Take 500 mg by mouth daily as needed for moderate pain.    . Ca Carbonate-Mag Hydroxide (ROLAIDS ANTACID ULTRA STRENGTH PO) Take 1-2 tablets by mouth at bedtime as needed (indigestion/heartburn).     . diazepam (VALIUM) 5 MG tablet Take 0.5 tablets (2.5 mg total) by mouth every 12 (twelve) hours as needed for anxiety or muscle spasms. 30 tablet 0  . DULoxetine (CYMBALTA) 30 MG capsule Take 1 capsule (30 mg total) by mouth daily. 90 capsule 0  . fluticasone (FLONASE) 50 MCG/ACT nasal spray Place 1 spray into both nostrils daily.    Marland Kitchen lisinopril-hydrochlorothiazide (PRINZIDE,ZESTORETIC) 10-12.5 MG tablet Take 1 tablet by mouth every evening.    . ondansetron (ZOFRAN) 4 MG tablet Take 4 mg by mouth every 8 (eight) hours as needed for nausea or vomiting.    Marland Kitchen OVER THE COUNTER MEDICATION Apply 1 application topically 5 (five) times daily as needed (for back pain relief). OUTBACK ALL-NATURAL PAIN RELIEF  (TEA TREE OIL/VANILLA/BLUE MALLE EUCALYPTUS/OLIVE OIL)     . Oxycodone HCl 10 MG TABS Take 5 mg by mouth 2 (two) times daily as needed (for moderate to severe pain.).      No current facility-administered medications for this visit.     Neurologic: Headache: No Seizure:  No Paresthesias:No  Musculoskeletal: Strength & Muscle Tone: within normal limits Gait & Station: normal Patient leans: N/A  Psychiatric Specialty Exam: Review of Systems  Musculoskeletal: Positive for back pain.  Psychiatric/Behavioral: Positive for depression. Negative for hallucinations, memory loss, substance abuse and suicidal ideas. The patient is nervous/anxious.   All other systems reviewed and are negative.   Blood pressure 127/82, pulse 68, height 5\' 8"  (1.727 m), weight 196 lb (88.9 kg), SpO2 96 %.Body mass index is 29.8 kg/m.  General Appearance: Fairly Groomed  Eye Contact:  Good  Speech:  Clear and Coherent  Volume:  Normal  Mood:  "much better"  Affect:  Appropriate, Congruent, Tearful and down at times  Thought Process:  Coherent  Orientation:  Full (Time, Place, and Person)  Thought Content:  Logical  Suicidal Thoughts:  No  Homicidal Thoughts:  No  Memory:  Immediate;   Good  Judgement:  Good  Insight:  Good  Psychomotor Activity:  Normal  Concentration:  Concentration: Good and Attention Span: Good  Recall:  Good  Fund of Knowledge:Good  Language: Good  Akathisia:  No  Handed:  Right  AIMS (if indicated):  N/A  Assets:  Communication Skills Desire for Improvement  ADL's:  Intact  Cognition: WNL  Sleep:  poor   Assessment Dakota Snow "Burlie" is a 52 y.o. year old male with a history of depression, spondylolisthesis L4-L5, lumbar radiculopathy, who presents for follow up appointment for MDD (major depressive disorder), recurrent episode, moderate (HCC)  # MDD, moderate, recurrent without psychotic features # PTSD Patient reports overall improvement in neurovegetative symptoms since discharged from the hospital/being started in duloxetine a couple of weeks ago.  Will continue current dose of duloxetine to target depression and PTSD.  Will consider up titration in the future to target residual mood symptoms. Will continue valium prn for anxiety.  Validated his grief of loss of his son. He is also demoralized due to his back pain. Discussed cognitive defusion and behavioral activation. He will greatly benefit from CBT/supportive therapy; will make referral after his surgery is done.    Plan 1. Continue  duloxetine 30 mg daily  2. Continue valium 2.5 mg - 5 mg daily as needed for anxiety 3. Return to clinic in one month for 30 mins (or later when he is able to come after surgery) 4. Emergency resources which includes 911, ED, suicide crisis line [phone]) are discussed.  - Will explore developmental history more as needed  The patient demonstrates the following risk factors for suicide: Chronic risk factors for suicide include: psychiatric disorder of depression and completed suicide in a family member. Acute risk factors for suicide include: unemployment, loss (financial, interpersonal, professional) and recent discharge from inpatient psychiatry. Protective factors for this patient include: positive social support, responsibility to others (children, family), coping skills and hope for the future. Considering these factors, the overall suicide risk at this point appears to be low. Patient is appropriate for outpatient follow up. Guns were removed by his wife   Treatment Plan Summary: Plan as above   [name], MD 6/14/201911:07 AM

## 2018-02-16 ENCOUNTER — Encounter (HOSPITAL_COMMUNITY): Payer: Self-pay | Admitting: Psychiatry

## 2018-02-16 ENCOUNTER — Ambulatory Visit (INDEPENDENT_AMBULATORY_CARE_PROVIDER_SITE_OTHER): Payer: Medicare Other | Admitting: Psychiatry

## 2018-02-16 VITALS — BP 127/82 | HR 68 | Ht 68.0 in | Wt 196.0 lb

## 2018-02-16 DIAGNOSIS — F331 Major depressive disorder, recurrent, moderate: Secondary | ICD-10-CM | POA: Diagnosis not present

## 2018-02-16 MED ORDER — DIAZEPAM 5 MG PO TABS: 2.5000 mg | ORAL_TABLET | Freq: Two times a day (BID) | ORAL | 0 refills | Status: DC | PRN

## 2018-02-16 MED ORDER — DULOXETINE HCL 30 MG PO CPEP: 30.0000 mg | ORAL_CAPSULE | Freq: Every day | ORAL | 0 refills | Status: DC

## 2018-02-16 NOTE — Patient Instructions (Signed)
1. Continue duloxetine 30 mg daily  2. Return to clinic in one month for 30 mins 3. CONTACT INFORMATION  What to do if you need to get in touch with someone regarding a psychiatric issue:  1. EMERGENCY: For psychiatric emergencies (if you are suicidal or if there are any other safety issues) call 911 and/or go to your nearest Emergency Room immediately.   2. IF YOU NEED SOMEONE TO TALK TO RIGHT NOW: Given my clinical responsibilities, I may not be able to speak with you over the phone for a prolonged period of time.  a. You may always call The National Suicide Prevention Lifeline at 1-800-273-TALK (313)054-7817(8255).  b. Your county of residence will also have local crisis services. For [facility]: [facility] at [phone]

## 2018-02-19 ENCOUNTER — Inpatient Hospital Stay (HOSPITAL_COMMUNITY): Payer: Medicare Other | Admitting: Anesthesiology

## 2018-02-19 ENCOUNTER — Inpatient Hospital Stay (HOSPITAL_COMMUNITY)
Admission: RE | Admit: 2018-02-19 | Discharge: 2018-02-21 | DRG: 454 | Disposition: A | Discharge status: Home / Self Care | Payer: Medicare Other | Attending: Neurological Surgery | Admitting: Neurological Surgery

## 2018-02-19 ENCOUNTER — Inpatient Hospital Stay (HOSPITAL_COMMUNITY): Payer: Medicare Other

## 2018-02-19 ENCOUNTER — Encounter (HOSPITAL_COMMUNITY): Payer: Self-pay | Admitting: Anesthesiology

## 2018-02-19 ENCOUNTER — Encounter (HOSPITAL_COMMUNITY)
Admission: RE | Disposition: A | Discharge status: Home / Self Care | Payer: Self-pay | Source: Home / Self Care | Attending: Neurological Surgery

## 2018-02-19 DIAGNOSIS — Z981 Arthrodesis status: Secondary | ICD-10-CM

## 2018-02-19 DIAGNOSIS — D62 Acute posthemorrhagic anemia: Secondary | ICD-10-CM | POA: Diagnosis not present

## 2018-02-19 DIAGNOSIS — M48061 Spinal stenosis, lumbar region without neurogenic claudication: Principal | ICD-10-CM | POA: Diagnosis present

## 2018-02-19 DIAGNOSIS — Z87891 Personal history of nicotine dependence: Secondary | ICD-10-CM

## 2018-02-19 DIAGNOSIS — I10 Essential (primary) hypertension: Secondary | ICD-10-CM | POA: Diagnosis present

## 2018-02-19 DIAGNOSIS — Z419 Encounter for procedure for purposes other than remedying health state, unspecified: Secondary | ICD-10-CM

## 2018-02-19 DIAGNOSIS — M4316 Spondylolisthesis, lumbar region: Secondary | ICD-10-CM

## 2018-02-19 DIAGNOSIS — M5136 Other intervertebral disc degeneration, lumbar region: Secondary | ICD-10-CM

## 2018-02-19 DIAGNOSIS — M549 Dorsalgia, unspecified: Secondary | ICD-10-CM | POA: Diagnosis present

## 2018-02-19 HISTORY — PX: APPLICATION OF ROBOTIC ASSISTANCE FOR SPINAL PROCEDURE: SHX6753

## 2018-02-19 SURGERY — POSTERIOR LUMBAR FUSION 1 WITH HARDWARE REMOVAL
Anesthesia: General | Site: Back

## 2018-02-19 MED ORDER — LIDOCAINE HCL (CARDIAC) PF 100 MG/5ML IV SOSY
PREFILLED_SYRINGE | INTRAVENOUS | Status: DC | PRN
  Administered 2018-02-19: 100 mg via INTRAVENOUS

## 2018-02-19 MED ORDER — ONDANSETRON HCL 4 MG/2ML IJ SOLN
INTRAMUSCULAR | Status: DC | PRN
  Administered 2018-02-19: 4 mg via INTRAVENOUS

## 2018-02-19 MED ORDER — SURGIFOAM 100 EX MISC
CUTANEOUS | Status: DC | PRN
  Administered 2018-02-19: 09:00:00 via TOPICAL

## 2018-02-19 MED ORDER — METHOCARBAMOL 1000 MG/10ML IJ SOLN
500.0000 mg | Freq: Four times a day (QID) | INTRAMUSCULAR | Status: DC | PRN
  Filled 2018-02-19: qty 5

## 2018-02-19 MED ORDER — PHENYLEPHRINE HCL 10 MG/ML IJ SOLN
INTRAMUSCULAR | Status: DC | PRN
  Administered 2018-02-19 (×7): 80 ug via INTRAVENOUS
  Administered 2018-02-19: 40 ug via INTRAVENOUS

## 2018-02-19 MED ORDER — CEFAZOLIN SODIUM-DEXTROSE 2-3 GM-%(50ML) IV SOLR
INTRAVENOUS | Status: DC | PRN
  Administered 2018-02-19: 2 g via INTRAVENOUS

## 2018-02-19 MED ORDER — OXYCODONE HCL 5 MG PO TABS
ORAL_TABLET | ORAL | Status: AC
  Filled 2018-02-19: qty 1

## 2018-02-19 MED ORDER — THROMBIN 5000 UNITS EX SOLR
CUTANEOUS | Status: AC
  Filled 2018-02-19: qty 5000

## 2018-02-19 MED ORDER — THROMBIN 20000 UNITS EX SOLR
CUTANEOUS | Status: AC
  Filled 2018-02-19: qty 20000

## 2018-02-19 MED ORDER — ACETAMINOPHEN 325 MG PO TABS: 650.0000 mg | ORAL_TABLET | ORAL | Status: DC | PRN

## 2018-02-19 MED ORDER — OXYCODONE HCL 5 MG PO TABS: 5.0000 mg | ORAL_TABLET | ORAL | Status: DC | PRN

## 2018-02-19 MED ORDER — 0.9 % SODIUM CHLORIDE (POUR BTL) OPTIME
TOPICAL | Status: DC | PRN
  Administered 2018-02-19: 1000 mL

## 2018-02-19 MED ORDER — ONDANSETRON HCL 4 MG PO TABS: 4.0000 mg | ORAL_TABLET | Freq: Four times a day (QID) | ORAL | Status: DC | PRN

## 2018-02-19 MED ORDER — SUGAMMADEX SODIUM 200 MG/2ML IV SOLN
INTRAVENOUS | Status: AC
  Filled 2018-02-19: qty 2

## 2018-02-19 MED ORDER — MENTHOL 3 MG MT LOZG: 1.0000 | LOZENGE | OROMUCOSAL | Status: DC | PRN

## 2018-02-19 MED ORDER — SODIUM CHLORIDE 0.9 % IV SOLN
INTRAVENOUS | Status: DC | PRN
  Administered 2018-02-19: 500 mL

## 2018-02-19 MED ORDER — MIDAZOLAM HCL 5 MG/5ML IJ SOLN
INTRAMUSCULAR | Status: DC | PRN
  Administered 2018-02-19 (×2): 1 mg via INTRAVENOUS

## 2018-02-19 MED ORDER — PHENOL 1.4 % MT LIQD: 1.0000 | OROMUCOSAL | Status: DC | PRN

## 2018-02-19 MED ORDER — SODIUM CHLORIDE 0.9% FLUSH: 3.0000 mL | INTRAVENOUS | Status: DC | PRN

## 2018-02-19 MED ORDER — FLUTICASONE PROPIONATE 50 MCG/ACT NA SUSP
1.0000 | Freq: Every day | NASAL | Status: DC
  Filled 2018-02-19: qty 16

## 2018-02-19 MED ORDER — ROCURONIUM BROMIDE 100 MG/10ML IV SOLN
INTRAVENOUS | Status: DC | PRN
  Administered 2018-02-19: 50 mg via INTRAVENOUS
  Administered 2018-02-19: 20 mg via INTRAVENOUS
  Administered 2018-02-19: 30 mg via INTRAVENOUS
  Administered 2018-02-19: 20 mg via INTRAVENOUS

## 2018-02-19 MED ORDER — BUPIVACAINE HCL (PF) 0.5 % IJ SOLN
INTRAMUSCULAR | Status: DC | PRN
  Administered 2018-02-19: 25 mL
  Administered 2018-02-19: 5 mL

## 2018-02-19 MED ORDER — CHLORHEXIDINE GLUCONATE CLOTH 2 % EX PADS: 6.0000 | MEDICATED_PAD | Freq: Once | CUTANEOUS | Status: DC

## 2018-02-19 MED ORDER — METHOCARBAMOL 500 MG PO TABS
500.0000 mg | ORAL_TABLET | Freq: Four times a day (QID) | ORAL | Status: DC | PRN
  Administered 2018-02-19: 500 mg via ORAL

## 2018-02-19 MED ORDER — CEFAZOLIN SODIUM-DEXTROSE 2-4 GM/100ML-% IV SOLN
2.0000 g | Freq: Three times a day (TID) | INTRAVENOUS | Status: AC
  Administered 2018-02-19 (×2): 2 g via INTRAVENOUS
  Filled 2018-02-19 (×2): qty 100

## 2018-02-19 MED ORDER — DEXAMETHASONE SODIUM PHOSPHATE 10 MG/ML IJ SOLN
INTRAMUSCULAR | Status: DC | PRN
  Administered 2018-02-19: 10 mg via INTRAVENOUS

## 2018-02-19 MED ORDER — GLYCOPYRROLATE 0.2 MG/ML IJ SOLN
INTRAMUSCULAR | Status: DC | PRN
  Administered 2018-02-19: 0.2 mg via INTRAVENOUS

## 2018-02-19 MED ORDER — LISINOPRIL-HYDROCHLOROTHIAZIDE 10-12.5 MG PO TABS: 1.0000 | ORAL_TABLET | Freq: Every evening | ORAL | Status: DC

## 2018-02-19 MED ORDER — DEXAMETHASONE SODIUM PHOSPHATE 10 MG/ML IJ SOLN
INTRAMUSCULAR | Status: AC
  Filled 2018-02-19: qty 1

## 2018-02-19 MED ORDER — DIAZEPAM 5 MG PO TABS
2.5000 mg | ORAL_TABLET | Freq: Four times a day (QID) | ORAL | Status: DC | PRN
  Administered 2018-02-19 – 2018-02-21 (×6): 5 mg via ORAL
  Filled 2018-02-19 (×6): qty 1

## 2018-02-19 MED ORDER — PHENYLEPHRINE HCL 10 MG/ML IJ SOLN
INTRAVENOUS | Status: DC | PRN
  Administered 2018-02-19: 50 ug/min via INTRAVENOUS

## 2018-02-19 MED ORDER — SUGAMMADEX SODIUM 200 MG/2ML IV SOLN
INTRAVENOUS | Status: DC | PRN
  Administered 2018-02-19: 200 mg via INTRAVENOUS

## 2018-02-19 MED ORDER — ONDANSETRON HCL 4 MG/2ML IJ SOLN: 4.0000 mg | Freq: Four times a day (QID) | INTRAMUSCULAR | Status: DC | PRN

## 2018-02-19 MED ORDER — SCOPOLAMINE 1 MG/3DAYS TD PT72
MEDICATED_PATCH | TRANSDERMAL | Status: DC | PRN
  Administered 2018-02-19: 1 via TRANSDERMAL

## 2018-02-19 MED ORDER — ONDANSETRON HCL 4 MG PO TABS: 4.0000 mg | ORAL_TABLET | Freq: Three times a day (TID) | ORAL | Status: DC | PRN

## 2018-02-19 MED ORDER — OXYCODONE HCL 5 MG PO TABS
5.0000 mg | ORAL_TABLET | Freq: Once | ORAL | Status: AC | PRN
  Administered 2018-02-19: 5 mg via ORAL

## 2018-02-19 MED ORDER — ALBUMIN HUMAN 5 % IV SOLN
INTRAVENOUS | Status: DC | PRN
  Administered 2018-02-19 (×2): via INTRAVENOUS

## 2018-02-19 MED ORDER — GLYCOPYRROLATE PF 0.2 MG/ML IJ SOSY
PREFILLED_SYRINGE | INTRAMUSCULAR | Status: AC
  Filled 2018-02-19: qty 1

## 2018-02-19 MED ORDER — PROPOFOL 10 MG/ML IV BOLUS
INTRAVENOUS | Status: AC
  Filled 2018-02-19: qty 40

## 2018-02-19 MED ORDER — ACETAMINOPHEN 650 MG RE SUPP: 650.0000 mg | RECTAL | Status: DC | PRN

## 2018-02-19 MED ORDER — FENTANYL CITRATE (PF) 250 MCG/5ML IJ SOLN
INTRAMUSCULAR | Status: AC
  Filled 2018-02-19: qty 5

## 2018-02-19 MED ORDER — POLYETHYLENE GLYCOL 3350 17 G PO PACK: 17.0000 g | PACK | Freq: Every day | ORAL | Status: DC | PRN

## 2018-02-19 MED ORDER — PHENYLEPHRINE 40 MCG/ML (10ML) SYRINGE FOR IV PUSH (FOR BLOOD PRESSURE SUPPORT)
PREFILLED_SYRINGE | INTRAVENOUS | Status: AC
  Filled 2018-02-19: qty 20

## 2018-02-19 MED ORDER — HYDROMORPHONE HCL 2 MG/ML IJ SOLN
INTRAMUSCULAR | Status: AC
  Filled 2018-02-19: qty 1

## 2018-02-19 MED ORDER — LACTATED RINGERS IV SOLN: INTRAVENOUS | Status: DC

## 2018-02-19 MED ORDER — BUPIVACAINE HCL (PF) 0.5 % IJ SOLN
INTRAMUSCULAR | Status: AC
  Filled 2018-02-19: qty 30

## 2018-02-19 MED ORDER — FLEET ENEMA 7-19 GM/118ML RE ENEM: 1.0000 | ENEMA | Freq: Once | RECTAL | Status: DC | PRN

## 2018-02-19 MED ORDER — EPHEDRINE SULFATE 50 MG/ML IJ SOLN
INTRAMUSCULAR | Status: AC
  Filled 2018-02-19: qty 1

## 2018-02-19 MED ORDER — METHOCARBAMOL 500 MG PO TABS
ORAL_TABLET | ORAL | Status: AC
  Filled 2018-02-19: qty 1

## 2018-02-19 MED ORDER — HYDROMORPHONE HCL 2 MG/ML IJ SOLN
0.3000 mg | INTRAMUSCULAR | Status: DC | PRN
  Administered 2018-02-19 (×4): 0.5 mg via INTRAVENOUS

## 2018-02-19 MED ORDER — SENNA 8.6 MG PO TABS
1.0000 | ORAL_TABLET | Freq: Two times a day (BID) | ORAL | Status: DC
  Administered 2018-02-19 – 2018-02-20 (×3): 8.6 mg via ORAL
  Filled 2018-02-19 (×3): qty 1

## 2018-02-19 MED ORDER — ALUM & MAG HYDROXIDE-SIMETH 200-200-20 MG/5ML PO SUSP: 30.0000 mL | Freq: Four times a day (QID) | ORAL | Status: DC | PRN

## 2018-02-19 MED ORDER — LIDOCAINE-EPINEPHRINE 1 %-1:100000 IJ SOLN
INTRAMUSCULAR | Status: AC
  Filled 2018-02-19: qty 1

## 2018-02-19 MED ORDER — OXYCODONE HCL 5 MG PO TABS
10.0000 mg | ORAL_TABLET | ORAL | Status: DC | PRN
  Administered 2018-02-19 – 2018-02-21 (×8): 10 mg via ORAL
  Filled 2018-02-19 (×8): qty 2

## 2018-02-19 MED ORDER — FENTANYL CITRATE (PF) 100 MCG/2ML IJ SOLN
INTRAMUSCULAR | Status: DC | PRN
  Administered 2018-02-19 (×2): 50 ug via INTRAVENOUS
  Administered 2018-02-19 (×2): 25 ug via INTRAVENOUS
  Administered 2018-02-19 (×2): 50 ug via INTRAVENOUS

## 2018-02-19 MED ORDER — DIAZEPAM 5 MG PO TABS: 2.5000 mg | ORAL_TABLET | Freq: Two times a day (BID) | ORAL | Status: DC | PRN

## 2018-02-19 MED ORDER — MORPHINE SULFATE (PF) 4 MG/ML IV SOLN: 4.0000 mg | INTRAVENOUS | Status: DC | PRN

## 2018-02-19 MED ORDER — EPHEDRINE SULFATE 50 MG/ML IJ SOLN
INTRAMUSCULAR | Status: DC | PRN
  Administered 2018-02-19 (×5): 10 mg via INTRAVENOUS

## 2018-02-19 MED ORDER — LIDOCAINE-EPINEPHRINE 1 %-1:100000 IJ SOLN
INTRAMUSCULAR | Status: DC | PRN
  Administered 2018-02-19: 5 mL

## 2018-02-19 MED ORDER — LACTATED RINGERS IV SOLN
INTRAVENOUS | Status: DC | PRN
  Administered 2018-02-19 (×3): via INTRAVENOUS

## 2018-02-19 MED ORDER — DULOXETINE HCL 30 MG PO CPEP
30.0000 mg | ORAL_CAPSULE | Freq: Every day | ORAL | Status: DC
  Administered 2018-02-20: 30 mg via ORAL
  Filled 2018-02-19: qty 1

## 2018-02-19 MED ORDER — BISACODYL 10 MG RE SUPP: 10.0000 mg | Freq: Every day | RECTAL | Status: DC | PRN

## 2018-02-19 MED ORDER — CEFAZOLIN SODIUM-DEXTROSE 2-4 GM/100ML-% IV SOLN
2.0000 g | INTRAVENOUS | Status: AC
  Administered 2018-02-19: 2 g via INTRAVENOUS
  Filled 2018-02-19: qty 100

## 2018-02-19 MED ORDER — PROPOFOL 10 MG/ML IV BOLUS
INTRAVENOUS | Status: DC | PRN
  Administered 2018-02-19: 200 mg via INTRAVENOUS

## 2018-02-19 MED ORDER — DOCUSATE SODIUM 100 MG PO CAPS
100.0000 mg | ORAL_CAPSULE | Freq: Two times a day (BID) | ORAL | Status: DC
  Administered 2018-02-19 – 2018-02-20 (×3): 100 mg via ORAL
  Filled 2018-02-19 (×3): qty 1

## 2018-02-19 MED ORDER — THROMBIN 5000 UNITS EX SOLR
OROMUCOSAL | Status: DC | PRN
  Administered 2018-02-19 (×2): via TOPICAL

## 2018-02-19 MED ORDER — OXYCODONE HCL 5 MG PO TABS: 5.0000 mg | ORAL_TABLET | Freq: Two times a day (BID) | ORAL | Status: DC | PRN

## 2018-02-19 MED ORDER — LIDOCAINE 2% (20 MG/ML) 5 ML SYRINGE
INTRAMUSCULAR | Status: AC
  Filled 2018-02-19: qty 5

## 2018-02-19 MED ORDER — SCOPOLAMINE 1 MG/3DAYS TD PT72
MEDICATED_PATCH | TRANSDERMAL | Status: AC
  Filled 2018-02-19: qty 2

## 2018-02-19 MED ORDER — OXYCODONE HCL 5 MG/5ML PO SOLN: 5.0000 mg | Freq: Once | ORAL | Status: AC | PRN

## 2018-02-19 MED ORDER — SODIUM CHLORIDE 0.9% FLUSH
3.0000 mL | Freq: Two times a day (BID) | INTRAVENOUS | Status: DC
  Administered 2018-02-19: 3 mL via INTRAVENOUS

## 2018-02-19 MED ORDER — MIDAZOLAM HCL 2 MG/2ML IJ SOLN
INTRAMUSCULAR | Status: AC
  Filled 2018-02-19: qty 2

## 2018-02-19 MED ORDER — ROCURONIUM BROMIDE 50 MG/5ML IV SOLN
INTRAVENOUS | Status: AC
  Filled 2018-02-19: qty 3

## 2018-02-19 MED ORDER — HYDROCHLOROTHIAZIDE 12.5 MG PO CAPS: 12.5000 mg | ORAL_CAPSULE | Freq: Every day | ORAL | Status: DC

## 2018-02-19 MED ORDER — LISINOPRIL 10 MG PO TABS: 10.0000 mg | ORAL_TABLET | Freq: Every day | ORAL | Status: DC

## 2018-02-19 MED ORDER — ONDANSETRON HCL 4 MG/2ML IJ SOLN
INTRAMUSCULAR | Status: AC
  Filled 2018-02-19: qty 2

## 2018-02-19 MED ORDER — SODIUM CHLORIDE 0.9 % IJ SOLN
INTRAMUSCULAR | Status: AC
  Filled 2018-02-19: qty 10

## 2018-02-19 SURGICAL SUPPLY — 84 items
ADH SKN CLS APL DERMABOND .7 (GAUZE/BANDAGES/DRESSINGS) ×1
APL SRG 60D 8 XTD TIP BNDBL (TIP) ×1
BAG DECANTER FOR FLEXI CONT (MISCELLANEOUS) ×2 IMPLANT
BASKET BONE COLLECTION (BASKET) ×2 IMPLANT
BIT DRILL LONG 3.0X30 (BIT) IMPLANT
BIT DRILL LONG 3X80 (BIT) IMPLANT
BIT DRILL LONG 4X80 (BIT) ×1 IMPLANT
BIT DRILL SHORT 3.0X30 (BIT) IMPLANT
BIT DRILL SHORT 3X80 (BIT) IMPLANT
BLADE CLIPPER SURG (BLADE) IMPLANT
BLADE SURG 11 STRL SS (BLADE) ×3 IMPLANT
BUR MATCHSTICK NEURO 3.0 LAGG (BURR) ×2 IMPLANT
CANISTER SUCT 3000ML PPV (MISCELLANEOUS) ×2 IMPLANT
CONNECTOR 5 IN 1 STRAIGHT STRL (MISCELLANEOUS) ×1 IMPLANT
CONT SPEC 4OZ CLIKSEAL STRL BL (MISCELLANEOUS) ×2 IMPLANT
DECANTER SPIKE VIAL GLASS SM (MISCELLANEOUS) ×2 IMPLANT
DERMABOND ADVANCED (GAUZE/BANDAGES/DRESSINGS) ×1
DERMABOND ADVANCED .7 DNX12 (GAUZE/BANDAGES/DRESSINGS) ×1 IMPLANT
DEVICE DISSECT PLASMABLAD 3.0S (MISCELLANEOUS) ×1 IMPLANT
DRAPE C-ARM 42X72 X-RAY (DRAPES) ×4 IMPLANT
DRAPE C-ARMOR (DRAPES) ×1 IMPLANT
DRAPE HALF SHEET 40X57 (DRAPES) ×1 IMPLANT
DRAPE LAPAROTOMY 100X72X124 (DRAPES) ×2 IMPLANT
DRAPE SHEET LG 3/4 BI-LAMINATE (DRAPES) ×2 IMPLANT
DRSG OPSITE POSTOP 4X6 (GAUZE/BANDAGES/DRESSINGS) ×1 IMPLANT
DRSG OPSITE POSTOP 4X8 (GAUZE/BANDAGES/DRESSINGS) ×1 IMPLANT
DURAPREP 26ML APPLICATOR (WOUND CARE) ×2 IMPLANT
DURASEAL APPLICATOR TIP (TIP) ×1 IMPLANT
DURASEAL SPINE SEALANT 3ML (MISCELLANEOUS) IMPLANT
ELECT BLADE 4.0 EZ CLEAN MEGAD (MISCELLANEOUS)
ELECT REM PT RETURN 9FT ADLT (ELECTROSURGICAL) ×2
ELECTRODE BLDE 4.0 EZ CLN MEGD (MISCELLANEOUS) IMPLANT
ELECTRODE REM PT RTRN 9FT ADLT (ELECTROSURGICAL) ×1 IMPLANT
GAUZE SPONGE 4X4 12PLY STRL (GAUZE/BANDAGES/DRESSINGS) ×1 IMPLANT
GAUZE SPONGE 4X4 16PLY XRAY LF (GAUZE/BANDAGES/DRESSINGS) IMPLANT
GLOVE BIOGEL PI IND STRL 6.5 (GLOVE) IMPLANT
GLOVE BIOGEL PI IND STRL 7.5 (GLOVE) IMPLANT
GLOVE BIOGEL PI IND STRL 8.5 (GLOVE) ×2 IMPLANT
GLOVE BIOGEL PI INDICATOR 6.5 (GLOVE) ×2
GLOVE BIOGEL PI INDICATOR 7.5 (GLOVE) ×2
GLOVE BIOGEL PI INDICATOR 8.5 (GLOVE) ×2
GLOVE ECLIPSE 7.0 STRL STRAW (GLOVE) ×1 IMPLANT
GLOVE ECLIPSE 7.5 STRL STRAW (GLOVE) ×1 IMPLANT
GLOVE ECLIPSE 8.5 STRL (GLOVE) ×4 IMPLANT
GLOVE SS N UNI LF 7.0 STRL (GLOVE) ×1 IMPLANT
GLOVE SURG SS PI 6.5 STRL IVOR (GLOVE) ×3 IMPLANT
GOWN STRL REUS W/ TWL LRG LVL3 (GOWN DISPOSABLE) IMPLANT
GOWN STRL REUS W/ TWL XL LVL3 (GOWN DISPOSABLE) IMPLANT
GOWN STRL REUS W/TWL 2XL LVL3 (GOWN DISPOSABLE) ×4 IMPLANT
GOWN STRL REUS W/TWL LRG LVL3 (GOWN DISPOSABLE) ×8
GOWN STRL REUS W/TWL XL LVL3 (GOWN DISPOSABLE)
HEMOSTAT POWDER KIT SURGIFOAM (HEMOSTASIS) ×2 IMPLANT
K-WIRE SHARP 1.6 500 (WIRE) ×8
KIT BASIN OR (CUSTOM PROCEDURE TRAY) ×2 IMPLANT
KIT SPINE MAZOR X ROBO DISP (MISCELLANEOUS) ×2 IMPLANT
KIT TURNOVER KIT B (KITS) ×2 IMPLANT
KWIRE SHARP 1.6 500 (WIRE) IMPLANT
MILL MEDIUM DISP (BLADE) ×2 IMPLANT
NEEDLE HYPO 22GX1.5 SAFETY (NEEDLE) ×2 IMPLANT
NS IRRIG 1000ML POUR BTL (IV SOLUTION) ×2 IMPLANT
PACK LAMINECTOMY NEURO (CUSTOM PROCEDURE TRAY) ×2 IMPLANT
PAD ARMBOARD 7.5X6 YLW CONV (MISCELLANEOUS) ×6 IMPLANT
PATTIES SURGICAL .5 X1 (DISPOSABLE) ×2 IMPLANT
PIN HEAD 2.5X60MM (PIN) IMPLANT
PLASMABLADE 3.0S (MISCELLANEOUS) ×2
ROD 40MM SPINAL (Rod) ×1 IMPLANT
ROD CREO 45MM SPINAL (Rod) ×1 IMPLANT
SCREW CANN CREO 6.5X50 THRD (Screw) ×4 IMPLANT
SCREW SCHANZ SA 4.0MM (MISCELLANEOUS) IMPLANT
SPACER SUSTAIN 9X26 12 8D (Spacer) ×2 IMPLANT
SPONGE LAP 4X18 RFD (DISPOSABLE) ×1 IMPLANT
SPONGE SURGIFOAM ABS GEL 100 (HEMOSTASIS) ×2 IMPLANT
STRIP BIOACTIVE 10CC 25X50X8 (Miscellaneous) ×1 IMPLANT
SUT PROLENE 6 0 BV (SUTURE) IMPLANT
SUT VIC AB 1 CT1 18XBRD ANBCTR (SUTURE) ×1 IMPLANT
SUT VIC AB 1 CT1 8-18 (SUTURE) ×2
SUT VIC AB 2-0 CP2 18 (SUTURE) ×2 IMPLANT
SUT VIC AB 3-0 SH 8-18 (SUTURE) ×3 IMPLANT
SYR 3ML LL SCALE MARK (SYRINGE) ×8 IMPLANT
TOWEL GREEN STERILE (TOWEL DISPOSABLE) ×2 IMPLANT
TOWEL GREEN STERILE FF (TOWEL DISPOSABLE) ×2 IMPLANT
TRAY FOLEY MTR SLVR 16FR STAT (SET/KITS/TRAYS/PACK) ×2 IMPLANT
TUBE MAZOR SA REDUCTION (TUBING) ×2 IMPLANT
WATER STERILE IRR 1000ML POUR (IV SOLUTION) ×2 IMPLANT

## 2018-02-19 NOTE — Op Note (Signed)
Date of surgery: 02/19/2018 Preoperative diagnosis: Spondylolisthesis L4-L5, history of previous decompression and fusion L5-S1, lumbar radiculopathy. Postoperative diagnosis: Same Procedure: Decompression at L4-L5 with total laminectomy L4-L5 posterior lumbar interbody arthrodesis using peek spacers local autograft and allograft.  Revision of pedicle screw fixation to include L4-L5 with removal of screws from L4 and L5 and repositioning of left-handed L5 screw.,  Robotic assistance.  Posterior lateral arthrodesis with local autograft and allograft, using Kinex 10 cc strip.  Sustained 12 mm RT cages with 8 degrees of lordosis.  6.5 x 50 mm screws at L4 and L5, Creo cannulated. Surgeon: Barnett Abu First assistant: Lisbeth Renshaw MD Anesthesia: General endotracheal Indications: Dakota Snow is a 52 year old individuals had previous decompression and fusion in his back at L5-S1.  His hardware was somewhat laterally placed at L5 on the left side and he had severe spondylitic degeneration at L4-L5 said a previous laminotomy at L4-5 on the left side.  Because of significant lateral recess stenosis he was advised regarding surgical intervention to decompress and stabilize L4-5.  Procedure: She was brought to the operating room supine on the stretcher.  After the smooth induction of general endotracheal anesthesia he was turned prone.  Is placed on the [place] table.  The surgical robot was attached to the [place] table and then the back was prepped with alcohol and DuraPrep and draped in a sterile fashion.  An elliptical incision was created around the previous scar.  This was excised.  Dissection was carried down to the lumbodorsal fascia which was opened on either side of the midline.  The dissection was then carried down to expose the old hardware.  The lamina of L4 was exposed.  The hardware was removed on the left side where the laterally placed screw at L5 was identified.  Then by attaching the  reference arm to the spinous process of L4 the robot was brought into position to place a new entry site at the L5 screw on the left and to place bilateral screws at L4.  The robot was used to place a K wire on the left at L5.  This was then tapped with a 6.5 mm tap and a 6.5 x 50 mm screw was placed on the left side at L5.  Then at L4 pedicle entry sites were created with the robotic assistance and K wires were placed after the robot arm was removed and Creo 6.5 x 50 mm screws were placed into each of the pedicles at L4.  Once all the hardware placement was performed the right-sided hardware was removed from L5-S1 and then laminectomies were created at L4-5.  Common dural tube was identified.  On the left-hand side there was substantial fibrosis from a previous laminotomy which I performed in 1997.  This was decompressed to allow good decompression of the L4 nerve root superiorly and the L5 nerve root inferiorly.  The space was isolated.  Total discectomy was then performed bilaterally at L4-5.  A 12 mm 8 degree lordotic cages were placed into the interspace at L4-5.  Total of 15 cc of bone graft that was a combination of autograft and Was placed into the interspace.  Lateral gutters were then packed with 3 cc each of the bone mixture.  The screws were connected with a 40 mm rod on the right side and a 45 mm rod on the left side.  The construct was set in a neutral position.  Final radiographs were obtained in AP and lateral projection.  Then  the lumbodorsal fascia was reapproximated with #1 Vicryl interrupted fashion 2-0 Vicryl was used in the subcutaneous tissues and 3-0 Vicryl subcuticularly.  Dermabond was used on the skin.  Blood loss is estimated total of 700 mL.  25 cc of half percent Marcaine was injected into the paraspinous fascia bilaterally at the closure of the fascia.

## 2018-02-19 NOTE — Plan of Care (Signed)
  Problem: Safety: Goal: Ability to remain free from injury will improve Outcome: Progressing   Problem: Safety: Goal: Ability to remain free from injury will improve Outcome: Progressing   Problem: Safety: Goal: Ability to remain free from injury will improve Outcome: Progressing   Problem: Bowel/Gastric: Goal: Gastrointestinal status for postoperative course will improve Outcome: Progressing   Problem: Education: Goal: Ability to verbalize activity precautions or restrictions will improve Outcome: Progressing Goal: Knowledge of the prescribed therapeutic regimen will improve Outcome: Progressing Goal: Understanding of discharge needs will improve Outcome: Progressing   Problem: Physical Regulation: Goal: Ability to maintain clinical measurements within normal limits will improve Outcome: Progressing Goal: Postoperative complications will be avoided or minimized Outcome: Progressing Goal: Diagnostic test results will improve Outcome: Progressing   Problem: Pain Management: Goal: Pain level will decrease Outcome: Progressing   Problem: Skin Integrity: Goal: Signs of wound healing will improve Outcome: Progressing   Problem: Health Behavior/Discharge Planning: Goal: Identification of resources available to assist in meeting health care needs will improve Outcome: Progressing   Problem: Bladder/Genitourinary: Goal: Urinary functional status for postoperative course will improve Outcome: Progressing

## 2018-02-19 NOTE — Transfer of Care (Signed)
Immediate Anesthesia Transfer of Care Note  Patient: Dakota Snow  Procedure(s) Performed: Posterior Lumbar Interbody Fusion Lumbar four-five,  removal hardware Lumbar five-Sacral one (N/A Back) APPLICATION OF ROBOTIC ASSISTANCE FOR SPINAL PROCEDURE (N/A Back)  Patient Location: PACU  Anesthesia Type:General  Level of Consciousness: awake, alert , oriented and sedated  Airway & Oxygen Therapy: Patient Spontanous Breathing and Patient connected to nasal cannula oxygen  Post-op Assessment: Report given to RN, Post -op Vital signs reviewed and stable and Patient moving all extremities  Post vital signs: Reviewed and stable  Last Vitals:  Vitals Value Taken Time  BP 132/62 02/19/2018 12:50 PM  Temp    Pulse 106 02/19/2018 12:53 PM  Resp 20 02/19/2018 12:53 PM  SpO2 99 % 02/19/2018 12:53 PM  Vitals shown include unvalidated device data.  Last Pain:  Vitals:   02/19/18 0644  TempSrc:   PainSc: 5          Complications: No apparent anesthesia complications

## 2018-02-19 NOTE — Anesthesia Procedure Notes (Addendum)
Procedure Name: Intubation Date/Time: 02/19/2018 7:55 AM Performed by: Fransisca KaufmannMeyer, Ginny Loomer E, CRNA Pre-anesthesia Checklist: Patient identified, Emergency Drugs available, Suction available and Patient being monitored Patient Re-evaluated:Patient Re-evaluated prior to induction Oxygen Delivery Method: Circle System Utilized Preoxygenation: Pre-oxygenation with 100% oxygen Induction Type: IV induction Ventilation: Mask ventilation without difficulty Laryngoscope Size: Miller and 2 Grade View: Grade I Tube type: Oral Tube size: 7.5 mm Number of attempts: 1 Airway Equipment and Method: Stylet and Oral airway Placement Confirmation: ETT inserted through vocal cords under direct vision,  positive ETCO2 and breath sounds checked- equal and bilateral Secured at: 23 cm Tube secured with: Tape Dental Injury: Teeth and Oropharynx as per pre-operative assessment

## 2018-02-19 NOTE — Progress Notes (Signed)
Patient ID: Dakota Snow, male   DOB: 05/13/1966, 52 y.o.   MRN: [id number] Vital signs are stable Motor function appears intact in lower extremities Patient is doing well postop

## 2018-02-19 NOTE — H&P (Signed)
MATSON WELCH is an 52 y.o. male.   Chief Complaint: Back pain and bilateral lower extremity pain. HPI: Lenon Kuennen is a 52 year old individual whom I have taken care of in the past.  He has had surgery for cervical spondylosis back in 1997 he has had a decompression of his lumbar spine around that time he did well for a while but then he found himself in [place] and developed significant back and left lower extremity pain.  He has surgery to decompress and stabilize L5-S1 at that time and he notes that his recovery and hospitalization took a long time.  Even so he always kept some left lower extremity pain.  I had seen him a year ago and discussed the situation and noting that he had significant pain we did a myelogram and post myelogram CAT scan that showed he had some right-sided nerve root entrapment at L5-S1 and extraforaminal zone and he had a moderately severe stenosis at L4-L5.  He also had a small extraforaminal disc at L2-3 we decided to treat this process conservatively.  Pain continued to get worse than this year I saw him and a CT scan of the back was performed which showed essentially the same findings as his myelogram after careful consideration I advised surgical decompression and stabilization at L4-L5 he is now to undergo this procedure.  Past Medical History:  Diagnosis Date  . Anxiety   . Arthritis   . Depression    watched son committ suicide  . GERD (gastroesophageal reflux disease)   . Hypertension    no meds  . PONV (postoperative nausea and vomiting)    HAD SOME ISSUES WITH NAUSEA.- THINKS ITS MEDICATION RELATED, NOT ANESTHESIA    Past Surgical History:  Procedure Laterality Date  . BACK SURGERY     X 2   . HAND SURGERY    . NECK SURGERY      Family History  Problem Relation Age of Onset  . Anxiety disorder Mother   . Depression Father    Social History:  reports that he has never smoked. He quit smokeless tobacco use about 2 months ago. His smokeless tobacco  use included chew. He reports that he drank alcohol. He reports that . Drug: Other-see comments.  Allergies: No Known Allergies  No medications prior to admission.    No results found for this or any previous visit (from the past 48 hour(s)). No results found.  Review of Systems  Constitutional: Negative.   HENT: Negative.   Eyes: Negative.   Respiratory: Negative.   Cardiovascular: Negative.   Gastrointestinal: Negative.   Genitourinary: Negative.   Musculoskeletal: Positive for back pain.  Skin: Negative.   Neurological: Positive for tingling, sensory change and focal weakness.  Endo/Heme/Allergies: Negative.   Psychiatric/Behavioral: Negative.     There were no vitals taken for this visit. Physical Exam  Constitutional: He is oriented to person, place, and time. He appears well-developed and well-nourished.  HENT:  Head: Normocephalic and atraumatic.  Eyes: Conjunctivae and EOM are normal.  Neck: Normal range of motion.  Cardiovascular: Normal rate and regular rhythm.  Respiratory: Effort normal and breath sounds normal.  GI: Soft. Bowel sounds are normal.  Musculoskeletal: Normal range of motion.  Neurological: He is alert and oriented to person, place, and time.  Absent deep tendon reflexes in the patella and the Achilles both.  Positive straight leg raising at 30 degrees and the right lower extremity and positive straight leg raising at 30 degrees in  the left lower extremity Patrick's maneuver is negative.  Station and gait are intact.  Skin: Skin is warm and dry.  Psychiatric: He has a normal mood and affect. His behavior is normal. Judgment and thought content normal.     Assessment/Plan Spondylolisthesis L4-L5 status post arthrodesis L5-S1.  Plan: Surgical revision of hardware to include L4-L5 with computer-assisted placement of screws in L4 and L5.  Stefani DamaELSNER,Norm Wray J, MD 02/19/2018, 1:23 AM

## 2018-02-19 NOTE — Anesthesia Preprocedure Evaluation (Addendum)
Anesthesia Evaluation  Patient identified by MRN, date of birth, ID band Patient awake    Reviewed: Allergy & Precautions, H&P , NPO status , Patient's Chart, lab work & pertinent test results  History of Anesthesia Complications (+) PONV and history of anesthetic complications  Airway Mallampati: II   Neck ROM: full    Dental   Pulmonary neg pulmonary ROS,    breath sounds clear to auscultation       Cardiovascular hypertension,  Rhythm:regular Rate:Normal     Neuro/Psych PSYCHIATRIC DISORDERS Anxiety Depression    GI/Hepatic GERD  ,  Endo/Other    Renal/GU      Musculoskeletal  (+) Arthritis ,   Abdominal   Peds  Hematology   Anesthesia Other Findings   Reproductive/Obstetrics                             Anesthesia Physical Anesthesia Plan  ASA: II  Anesthesia Plan: General   Post-op Pain Management:    Induction: Intravenous  PONV Risk Score and Plan: 3 and Ondansetron, Dexamethasone, Midazolam, Scopolamine patch - Pre-op and Treatment may vary due to age or medical condition  Airway Management Planned: Oral ETT  Additional Equipment:   Intra-op Plan:   Post-operative Plan: Extubation in OR  Informed Consent: I have reviewed the patients History and Physical, chart, labs and discussed the procedure including the risks, benefits and alternatives for the proposed anesthesia with the patient or authorized representative who has indicated his/her understanding and acceptance.     Plan Discussed with: CRNA, Anesthesiologist and Surgeon  Anesthesia Plan Comments:         Anesthesia Quick Evaluation

## 2018-02-20 ENCOUNTER — Other Ambulatory Visit: Payer: Self-pay

## 2018-02-20 LAB — CBC
HCT: 34.5 % — ABNORMAL LOW (ref 39.0–52.0)
Hemoglobin: 11.4 g/dL — ABNORMAL LOW (ref 13.0–17.0)
MCH: 29.8 pg (ref 26.0–34.0)
MCHC: 33 g/dL (ref 30.0–36.0)
MCV: 90.3 fL (ref 78.0–100.0)
PLATELETS: 230 10*3/uL (ref 150–400)
RBC: 3.82 MIL/uL — AB (ref 4.22–5.81)
RDW: 13.2 % (ref 11.5–15.5)
WBC: 16.6 10*3/uL — ABNORMAL HIGH (ref 4.0–10.5)

## 2018-02-20 LAB — BASIC METABOLIC PANEL
Anion gap: 8 (ref 5–15)
BUN: 8 mg/dL (ref 6–20)
CALCIUM: 9.3 mg/dL (ref 8.9–10.3)
CO2: 30 mmol/L (ref 22–32)
CREATININE: 1.12 mg/dL (ref 0.61–1.24)
Chloride: 100 mmol/L — ABNORMAL LOW (ref 101–111)
GFR calc Af Amer: >60 mL/min (ref >60)
GLUCOSE: 104 mg/dL — AB (ref 65–99)
POTASSIUM: 4.1 mmol/L (ref 3.5–5.1)
SODIUM: 138 mmol/L (ref 135–145)

## 2018-02-20 MED ORDER — DEXAMETHASONE 4 MG PO TABS: 4.0000 mg | ORAL_TABLET | Freq: Once | ORAL | Status: DC

## 2018-02-20 MED ORDER — DIAZEPAM 5 MG PO TABS: 5.0000 mg | ORAL_TABLET | Freq: Four times a day (QID) | ORAL | 0 refills | Status: DC | PRN

## 2018-02-20 MED ORDER — OXYCODONE-ACETAMINOPHEN 5-325 MG PO TABS: 1.0000 | ORAL_TABLET | ORAL | 0 refills | Status: DC | PRN

## 2018-02-20 MED FILL — Gelatin Absorbable MT Powder: OROMUCOSAL | Qty: 1 | Status: AC

## 2018-02-20 MED FILL — Thrombin For Soln 5000 Unit: CUTANEOUS | Qty: 5000 | Status: AC

## 2018-02-20 NOTE — Discharge Instructions (Signed)
Wound Care Leave incision open to air. You may shower. Do not scrub directly on incision.  Do not put any creams, lotions, or ointments on incision. Activity Walk each and every day, increasing distance each day. No lifting greater than 5 lbs.   No driving for 2 weeks; may ride as a passenger locally. Wear brace at all times except when in bed. Diet Resume your normal diet.  Return to Work Will be discussed at you follow up appointment. Call Your Doctor If Any of These Occur Redness, drainage, or swelling at the wound.  Temperature greater than 101 degrees. Severe pain not relieved by pain medication. Increased difficulty swallowing. Incision starts to come apart. Follow Up Appt Call today for appointment in 1-2 weeks [phone] or for problems.  If you have any hardware placed in your spine, you will need an x-ray before your appointment.

## 2018-02-20 NOTE — Evaluation (Signed)
Occupational Therapy Evaluation and Discharge Patient Details Name: Dakota Snow MRN: [id number] DOB: 10-28-1965 Today's Date: 02/20/2018    History of Present Illness Pt is a 52yo male found to have spondylolisthesis L4-5 who underwent PLIF with hardware removal at L4-5.  Pt with h/o previous back and neck surgeries.   Clinical Impression   PTA, pt was independent with ADL and functional mobility although ambulation was difficult. Pt currently limited by lower back pain but is motivated to participate with therapies. He was able to complete toilet transfers and tub/shower transfers with supervision as well as LB dressing tasks with min assist. Pt and brother educated concerning back precautions related to ADL, brace wear schedule, never scrub over incision, safe use of 3-in-1 over commode and as a shower seat, and safe compensatory strategies for grooming and dressing tasks. He verbalized and demonstrated understanding of all topics. Pt's wife and brother will be able to provide all necessary assistance at home. Do not anticipate need for OT follow-up post-acute D/C. No further OT needs identified. Will sign off.     Follow Up Recommendations  No OT follow up;Supervision/Assistance - 24 hour    Equipment Recommendations  3 in 1 bedside commode    Recommendations for Other Services       Precautions / Restrictions Precautions Precautions: Back Precaution Booklet Issued: Yes (comment) Precaution Comments: PT provided handout. Continued education concerning back precautions related to ADL.  Restrictions Weight Bearing Restrictions: No      Mobility Bed Mobility Overal bed mobility: Needs Assistance Bed Mobility: Rolling;Sidelying to Sit Rolling: Supervision Sidelying to sit: Supervision       General bed mobility comments: Pt demonstrating good technique for log roll technique. Supervision for safety only.   Transfers Overall transfer level: Needs assistance Equipment used:  None Transfers: Sit to/from Stand Sit to Stand: Supervision         General transfer comment: Supervision for safety during power up to standing.     Balance Overall balance assessment: Mild deficits observed, not formally tested                                         ADL either performed or assessed with clinical judgement   ADL Overall ADL's : Needs assistance/impaired Eating/Feeding: Modified independent   Grooming: Modified independent;Standing   Upper Body Bathing: Modified independent;Sitting   Lower Body Bathing: Minimal assistance;Sit to/from stand   Upper Body Dressing : Modified independent;Sitting   Lower Body Dressing: Minimal assistance;Sit to/from stand   Toilet Transfer: Supervision/safety;Ambulation   Toileting- Clothing Manipulation and Hygiene: Supervision/safety;Sit to/from stand   Tub/ Shower Transfer: Supervision/safety;Ambulation;3 in 1;Tub transfer Tub/Shower Transfer Details (indicate cue type and reason): Simulated tub/shower transfer with 3-in-1.  Functional mobility during ADLs: Supervision/safety General ADL Comments: Pt and brother educated concerning back precautions and compensatory strategies for ADL participation.      Vision Patient Visual Report: No change from baseline Vision Assessment?: No apparent visual deficits     Perception     Praxis      Pertinent Vitals/Pain Pain Assessment: 0-10 Pain Score: 7  Faces Pain Scale: Hurts a little bit Pain Location: back at incision Pain Descriptors / Indicators: Grimacing Pain Intervention(s): Limited activity within patient's tolerance;Monitored during session;Repositioned     Hand Dominance Right   Extremity/Trunk Assessment Upper Extremity Assessment Upper Extremity Assessment: Overall WFL for tasks assessed   Lower  Extremity Assessment Lower Extremity Assessment: Defer to PT evaluation LLE Deficits / Details: grossly 4/5, reports numbness in thigh, reports  "I can barely feel this scratching" as he stratches his thight   Cervical / Trunk Assessment Cervical / Trunk Assessment: Other exceptions Cervical / Trunk Exceptions: multiple neck and back surgeries, recent back surgery   Communication Communication Communication: No difficulties   Cognition Arousal/Alertness: Awake/alert Behavior During Therapy: WFL for tasks assessed/performed Overall Cognitive Status: Within Functional Limits for tasks assessed                                 General Comments: Pt processing information well.    General Comments  dressing intact on back incision    Exercises Exercises: Other exercises Other Exercises Other Exercises: discussed isometric abd contractions in supine hooklying and posterior pelvic titls as well   Shoulder Instructions      Home Living Family/patient expects to be discharged to:: Private residence Living Arrangements: Spouse/significant other Available Help at Discharge: Family;Available 24 hours/day(spouse, brother) Type of Home: House Home Access: Stairs to enter Enter[employer] CorporationEntrance Stairs-Number of Steps: 1 Entrance Stairs-Rails: None Home Layout: Two level;Able to live on main level with bedroom/bathroom Alternate Level Stairs-Number of Steps: 12 Alternate Level Stairs-Rails: Can reach both Bathroom Shower/Tub: Chief Strategy OfficerTub/shower unit   Bathroom Toilet: Standard     Home Equipment: None   Additional Comments: brother from [place] coming to stay for 2 weeks in addition to spouse being home as well      Prior Functioning/Environment Level of Independence: Independent        Comments: but reported difficulty walking        OT Problem List: Decreased activity tolerance;Impaired balance (sitting and/or standing);Decreased safety awareness;Decreased knowledge of precautions;Decreased knowledge of use of DME or AE;Pain      OT Treatment/Interventions:      OT Goals(Current goals can be found in the care plan section)  Acute Rehab OT Goals Patient Stated Goal: get back to doing things, especially walking OT Goal Formulation: With patient/family  OT Frequency:     Barriers to D/C:            Co-evaluation              AM-PAC PT "6 Clicks" Daily Activity     Outcome Measure Help from another person eating meals?: None Help from another person taking care of personal grooming?: None Help from another person toileting, which includes using toliet, bedpan, or urinal?: A Little Help from another person bathing (including washing, rinsing, drying)?: A Little Help from another person to put on and taking off regular upper body clothing?: None Help from another person to put on and taking off regular lower body clothing?: A Little 6 Click Score: 21   End of Session Equipment Utilized During Treatment: Back brace Nurse Communication: Mobility status(equipment recommendations)  Activity Tolerance: Patient tolerated treatment well Patient left: with call bell/phone within reach;with family/visitor present(preparing to return to bed)  OT Visit Diagnosis: Other abnormalities of gait and mobility (R26.89);Muscle weakness (generalized) (M62.81);Pain Pain - Right/Left: (back) Pain - part of body: (back)                Time: [id number] OT Time Calculation (min): 21 min Charges:  OT General Charges $OT Visit: 1 Visit OT Evaluation $OT Eval Low Complexity: 1 Low G-Codes:     Doristine Sectionharity A Jadarian Mckay, MS OTR/L  Pager: [phone]   [facility] PhoenixCharity  A Saida Lonon 02/20/2018, 10:23 AM

## 2018-02-20 NOTE — Evaluation (Signed)
Physical Therapy Evaluation Patient Details Name: KRISTIN LAMAGNA MRN: [id number] DOB: 1966/07/26 Today's Date: 02/20/2018   History of Present Illness  Pt is a 52yo male found to have spondylolisthesis L4-5 who underwent PLIF with hardware removal at L4-5.  Pt with h/o previous back and neck surgeries.  Clinical Impression  Patient is s/p above surgery resulting in the deficits listed below (see PT Problem List). Pt tolerated OOB mobility well for first time up. Pt with good home set up and support. Patient will benefit from skilled PT to increase their independence and safety with mobility (while adhering to their precautions) to allow discharge to the venue listed below.     Follow Up Recommendations No PT follow up;Supervision - Intermittent    Equipment Recommendations  3in1 (PT)    Recommendations for Other Services       Precautions / Restrictions Precautions Precautions: Back Precaution Booklet Issued: Yes (comment) Precaution Comments: pt with verbal understanding Restrictions Weight Bearing Restrictions: No      Mobility  Bed Mobility Overal bed mobility: Needs Assistance Bed Mobility: Rolling;Sidelying to Sit Rolling: Supervision Sidelying to sit: Supervision       General bed mobility comments: pt with good technique, HOB flat, no use of bed rails, adhered to precautions  Transfers Overall transfer level: Needs assistance Equipment used: None Transfers: Sit to/from Stand Sit to Stand: Supervision         General transfer comment: pt with minimal trunk flexion, placed hands on his knees to power up, increased time, no physical assist needed  Ambulation/Gait Ambulation/Gait assistance: Supervision Gait Distance (Feet): 200 Feet Assistive device: None Gait Pattern/deviations: Step-through pattern;Decreased stride length;Trunk flexed Gait velocity: slow Gait velocity interpretation: >2.62 ft/sec, indicative of community ambulatory General Gait Details:  v/c's to contract abdominal muscles to support back  Stairs Stairs: Yes Stairs assistance: Min guard Stair Management: No rails;Step to pattern Number of Stairs: 1(to mimic home set up)    Wheelchair Mobility    Modified Rankin (Stroke Patients Only)       Balance Overall balance assessment: Mild deficits observed, not formally tested                                           Pertinent Vitals/Pain Pain Assessment: Faces Faces Pain Scale: Hurts a little bit Pain Location: back Pain Descriptors / Indicators: Grimacing(with mobility) Pain Intervention(s): Monitored during session    Home Living Family/patient expects to be discharged to:: Private residence Living Arrangements: Spouse/significant other   Type of Home: House Home Access: Stairs to enter Entrance Stairs-Rails: None Entrance Stairs-Number of Steps: 1 Home Layout: Two level;Able to live on main level with bedroom/bathroom Home Equipment: None Additional Comments: brother from [place] coming to stay for 2 weeks in addition to spouse being home as well    Prior Function Level of Independence: Independent         Comments: but reported difficulty walking     Hand Dominance   Dominant Hand: Right    Extremity/Trunk Assessment   Upper Extremity Assessment Upper Extremity Assessment: Overall WFL for tasks assessed    Lower Extremity Assessment Lower Extremity Assessment: RLE deficits/detail;LLE deficits/detail LLE Deficits / Details: grossly 4/5, reports numbness in thigh, reports "I can barely feel this scratching" as he stratches his thight    Cervical / Trunk Assessment Cervical / Trunk Assessment: Other exceptions Cervical / Trunk Exceptions:  multiple neck and back surgeries, recent back surgery  Communication   Communication: No difficulties  Cognition Arousal/Alertness: Awake/alert Behavior During Therapy: WFL for tasks assessed/performed Overall Cognitive Status: Within  Functional Limits for tasks assessed                                 General Comments: pt with flat affect, states his son committed suicide 2.5 years ago      General Comments General comments (skin integrity, edema, etc.): dressing intact on back incision    Exercises Other Exercises Other Exercises: discussed isometric abd contractions in supine hooklying and posterior pelvic titls as well   Assessment/Plan    PT Assessment Patient needs continued PT services  PT Problem List Decreased strength;Decreased range of motion;Decreased activity tolerance;Decreased mobility       PT Treatment Interventions DME instruction;Gait training;Stair training;Functional mobility training;Therapeutic activities;Therapeutic exercise;Balance training    PT Goals (Current goals can be found in the Care Plan section)  Acute Rehab PT Goals Patient Stated Goal: get back to doing things, especially walking PT Goal Formulation: With patient Time For Goal Achievement: 03/06/18 Potential to Achieve Goals: Good    Frequency Min 5X/week   Barriers to discharge        Co-evaluation               AM-PAC PT "6 Clicks" Daily Activity  Outcome Measure Difficulty turning over in bed (including adjusting bedclothes, sheets and blankets)?: None Difficulty moving from lying on back to sitting on the side of the bed? : None Difficulty sitting down on and standing up from a chair with arms (e.g., wheelchair, bedside commode, etc,.)?: None Help needed moving to and from a bed to chair (including a wheelchair)?: A Little Help needed walking in hospital room?: A Little Help needed climbing 3-5 steps with a railing? : A Little 6 Click Score: 21    End of Session Equipment Utilized During Treatment: Gait belt Activity Tolerance: Patient tolerated treatment well Patient left: in chair;with call bell/phone within reach Nurse Communication: Mobility status PT Visit Diagnosis: Unsteadiness  on feet (R26.81)    Time: 1191-47820737-0803 PT Time Calculation (min) (ACUTE ONLY): 26 min   Charges:   PT Evaluation $PT Eval Moderate Complexity: 1 Mod PT Treatments $Gait Training: 8-22 mins   PT G Codes:        Lewis ShockAshly Janice Seales, PT, DPT Pager #: [phone] Office #: [phone]   Tocara Mennen M Audie Wieser 02/20/2018, 8:25 AM

## 2018-02-20 NOTE — Progress Notes (Signed)
Patient ID: Dakota Snow, male   DOB: 02/27/1966, 52 y.o.   MRN: [id number] Vital signs are stable Motor function is intact Dressing is clean and dry Patient is ready for discharge Wishes to go home in the a.m.

## 2018-02-20 NOTE — Anesthesia Postprocedure Evaluation (Signed)
Anesthesia Post Note  Patient: Meda CoffeeShane D Connon  Procedure(s) Performed: Posterior Lumbar Interbody Fusion Lumbar four-five,  removal hardware Lumbar five-Sacral one (N/A Back) APPLICATION OF ROBOTIC ASSISTANCE FOR SPINAL PROCEDURE (N/A Back)     Patient location during evaluation: PACU Anesthesia Type: General Level of consciousness: awake and alert Pain management: pain level controlled Vital Signs Assessment: post-procedure vital signs reviewed and stable Respiratory status: spontaneous breathing, nonlabored ventilation, respiratory function stable and patient connected to nasal cannula oxygen Cardiovascular status: blood pressure returned to baseline and stable Postop Assessment: no apparent nausea or vomiting Anesthetic complications: no    Last Vitals:  Vitals:   02/20/18 0546 02/20/18 0731  BP: 103/64 116/67  Pulse: 69 72  Resp:  18  Temp: 36.6 C   SpO2: 99% 99%    Last Pain:  Vitals:   02/20/18 0649  TempSrc:   PainSc: 3                  Jamyria Ozanich S

## 2018-02-20 NOTE — Discharge Summary (Signed)
Physician Discharge Summary  Patient ID: Dakota Snow MRN: [id number] DOB/AGE: 1966/03/05 52 y.o.  Admit date: 02/19/2018 Discharge date: 02/20/2018  Admission Diagnoses: Lumbar stenosis L4-L5 history of fusion L5-S1, lumbar radiculopathy.  Acute blood loss anemia  Discharge Diagnoses: Lumbar stenosis L4-L5.  History of fusion L5-S1, lumbar radiculopathy Active Problems:   Lumbar adjacent segment disease with spondylolisthesis   Discharged Condition: good  Hospital Course: Patient was admitted to undergo surgical decompression and stabilization at L4-L5.  He tolerated surgery well.  Patient had a 3.2 g drop in hemoglobin postoperatively secondary to acute blood loss anemia.  Consults: None  Significant Diagnostic Studies: None  Treatments: surgery: Decompression and fusion L4-L5 pedicle screw fixation L4-L5  Discharge Exam: Blood pressure 117/68, pulse 75, temperature 98.7 F (37.1 C), temperature source Oral, resp. rate 17, height 5\' 8"  (1.727 m), weight 88 kg (194 lb), SpO2 100 %. Incision is clean and dry motor function is intact in lower extremities  Disposition: Discharge disposition: 01-Home or Self Care       Discharge Instructions    Call MD for:  redness, tenderness, or signs of infection (pain, swelling, redness, odor or green/yellow discharge around incision site)   Complete by:  As directed    Call MD for:  severe uncontrolled pain   Complete by:  As directed    Call MD for:  temperature >100.4   Complete by:  As directed    Diet - low sodium heart healthy   Complete by:  As directed    Discharge instructions   Complete by:  As directed    Okay to shower. Do not apply salves or appointments to incision. No heavy lifting with the upper extremities greater than 15 pounds. May resume driving when not requiring pain medication and patient feels comfortable with doing so.   Incentive spirometry RT   Complete by:  As directed    Increase activity slowly    Complete by:  As directed      Allergies as of 02/20/2018   No Known Allergies     Medication List    TAKE these medications   acetaminophen 500 MG tablet Commonly known as:  TYLENOL Take 500 mg by mouth daily as needed for moderate pain.   diazepam 5 MG tablet Commonly known as:  VALIUM Take 0.5 tablets (2.5 mg total) by mouth every 12 (twelve) hours as needed for anxiety or muscle spasms. What changed:  Another medication with the same name was added. Make sure you understand how and when to take each.   diazepam 5 MG tablet Commonly known as:  VALIUM Take 1 tablet (5 mg total) by mouth every 6 (six) hours as needed for muscle spasms. What changed:  You were already taking a medication with the same name, and this prescription was added. Make sure you understand how and when to take each.   DULoxetine 30 MG capsule Commonly known as:  CYMBALTA Take 1 capsule (30 mg total) by mouth daily.   fluticasone 50 MCG/ACT nasal spray Commonly known as:  FLONASE Place 1 spray into both nostrils daily.   lisinopril-hydrochlorothiazide 10-12.5 MG tablet Commonly known as:  PRINZIDE,ZESTORETIC Take 1 tablet by mouth every evening.   ondansetron 4 MG tablet Commonly known as:  ZOFRAN Take 4 mg by mouth every 8 (eight) hours as needed for nausea or vomiting.   OVER THE COUNTER MEDICATION Apply 1 application topically 5 (five) times daily as needed (for back pain relief). OUTBACK ALL-NATURAL PAIN RELIEF  (  TEA TREE OIL/VANILLA/BLUE MALLE EUCALYPTUS/OLIVE OIL)   Oxycodone HCl 10 MG Tabs Take 5 mg by mouth 2 (two) times daily as needed (for moderate to severe pain.).   oxyCODONE-acetaminophen 5-325 MG tablet Commonly known as:  PERCOCET Take 1-2 tablets by mouth every 4 (four) hours as needed for severe pain.   ROLAIDS ANTACID ULTRA STRENGTH PO Take 1-2 tablets by mouth at bedtime as needed (indigestion/heartburn).            Durable Medical Equipment  (From admission,  onward)        Start     Ordered   02/20/18 1014  For home use only DME 3 n 1  Once     02/20/18 1014       Signed: Joel Cowin J 02/20/2018, 6:41 PM

## 2018-02-21 NOTE — Progress Notes (Signed)
Physical Therapy Treatment and Discharge Patient Details Name: Dakota Snow MRN: [id number] DOB: 1965/12/13 Today's Date: 02/21/2018    History of Present Illness Pt is a 52 y/o male found to have spondylolisthesis L4-5 who underwent PLIF with hardware removal at L4-5.  Pt with h/o previous back and neck surgeries.    PT Comments    Pt progressing well with post-op mobility. Overall moving slow but steady with no need for assist throughout session. Pt declined stair training as he practiced yesterday with PT and reports feeling comfortable. Pt was educated on car transfer, brace application/wearing schedule, activity progression, and general safety with mobility at home. Will sign off at this time as pt is mod I with functional mobility and will have adequate support at home. If needs change, please reconsult.    Follow Up Recommendations  No PT follow up;Supervision - Intermittent     Equipment Recommendations  3in1 (PT)    Recommendations for Other Services       Precautions / Restrictions Precautions Precautions: Back Precaution Booklet Issued: Yes (comment) Precaution Comments: Pt was able to recall 3/3 precautions. Pt was cued for maintenance of precautions during functional mobility.  Restrictions Weight Bearing Restrictions: No    Mobility  Bed Mobility               General bed mobility comments: Pt was sitting EOB when PT arrived.   Transfers Overall transfer level: Modified independent Equipment used: None Transfers: Sit to/from Stand           General transfer comment: Pt required increased time but no assistance.   Ambulation/Gait Ambulation/Gait assistance: Modified independent (Device/Increase time) Gait Distance (Feet): 250 Feet Assistive device: None Gait Pattern/deviations: Step-through pattern;Decreased stride length;Trunk flexed Gait velocity: slow Gait velocity interpretation: 1.31 - 2.62 ft/sec, indicative of limited community  ambulator General Gait Details: Slow but generally steady. No LOB noted.    Stairs             Wheelchair Mobility    Modified Rankin (Stroke Patients Only)       Balance Overall balance assessment: Mild deficits observed, not formally tested                                          Cognition Arousal/Alertness: Awake/alert Behavior During Therapy: WFL for tasks assessed/performed Overall Cognitive Status: Within Functional Limits for tasks assessed                                        Exercises      General Comments        Pertinent Vitals/Pain Pain Assessment: 0-10 Pain Score: 5  Pain Location: back at incision Pain Descriptors / Indicators: Grimacing Pain Intervention(s): Monitored during session    Home Living                      Prior Function            PT Goals (current goals can now be found in the care plan section) Acute Rehab PT Goals Patient Stated Goal: get back to doing things, especially walking PT Goal Formulation: With patient Time For Goal Achievement: 03/06/18 Potential to Achieve Goals: Good Progress towards PT goals: Progressing toward goals    Frequency  Min 5X/week      PT Plan Current plan remains appropriate    Co-evaluation              AM-PAC PT "6 Clicks" Daily Activity  Outcome Measure  Difficulty turning over in bed (including adjusting bedclothes, sheets and blankets)?: None Difficulty moving from lying on back to sitting on the side of the bed? : None Difficulty sitting down on and standing up from a chair with arms (e.g., wheelchair, bedside commode, etc,.)?: None Help needed moving to and from a bed to chair (including a wheelchair)?: None Help needed walking in hospital room?: None Help needed climbing 3-5 steps with a railing? : A Little 6 Click Score: 23    End of Session Equipment Utilized During Treatment: Gait belt Activity Tolerance: Patient  tolerated treatment well Patient left: in chair;with call bell/phone within reach Nurse Communication: Mobility status PT Visit Diagnosis: Unsteadiness on feet (R26.81)     Time: [id number] PT Time Calculation (min) (ACUTE ONLY): 13 min  Charges:  $Gait Training: 8-22 mins                    G Codes:       Dakota Snow, PT, DPT Acute Rehabilitation Services Pager: [phone]    Dakota Snow 02/21/2018, 10:07 AM

## 2018-02-21 NOTE — Progress Notes (Signed)
Pt and brother given D/C instructions with Rx's, verbal understanding was provided. Pt's incision is open to air with no sign of infection. Pt's IV was removed prior to D/C. Pt D/C'd home via wheelchair per MD order. Pt is stable @ D/C and has no other needs at this time. Rema FendtAshley Leslieann Whisman, RN

## 2018-02-22 ENCOUNTER — Encounter (HOSPITAL_COMMUNITY): Payer: Self-pay | Admitting: Neurological Surgery

## 2018-03-14 ENCOUNTER — Ambulatory Visit (INDEPENDENT_AMBULATORY_CARE_PROVIDER_SITE_OTHER): Payer: Medicare Other | Admitting: Psychiatry

## 2018-03-14 ENCOUNTER — Encounter (HOSPITAL_COMMUNITY): Payer: Self-pay | Admitting: Psychiatry

## 2018-03-14 VITALS — BP 127/83 | HR 77 | Ht 68.0 in | Wt 198.0 lb

## 2018-03-14 DIAGNOSIS — F331 Major depressive disorder, recurrent, moderate: Secondary | ICD-10-CM

## 2018-03-14 MED ORDER — DIAZEPAM 5 MG PO TABS: 2.5000 mg | ORAL_TABLET | Freq: Two times a day (BID) | ORAL | 0 refills | Status: DC | PRN

## 2018-03-14 MED ORDER — DULOXETINE HCL 60 MG PO CPEP: 60.0000 mg | ORAL_CAPSULE | Freq: Every day | ORAL | 0 refills | Status: DC

## 2018-03-14 NOTE — Patient Instructions (Signed)
1. Increase duloxetine 60 mg daily  2. Continue valium  2.5 mg - 5 mg daily as needed for anxiety (refill after 7/24) 3. Return to clinic in one month for 30 mins

## 2018-03-14 NOTE — Progress Notes (Signed)
BH MD/PA/NP OP Progress Note  03/14/2018 12:15 PM Dakota Snow  MRN:  [id number]  Chief Complaint:  Chief Complaint    Follow-up; Trauma; Depression; Anxiety     HPI:  Patient presents for follow-up appointment for depression and PTSD.  He underwent surgery a few weeks ago. . He believes it went very well and he feels good about it. Her brother from [place] visited the patient for two weeks to take care after surgery. He was very depressed and was scared that he was back to the previous state (of depression) around 2023/03/24. He stats that his deceased son used to like 07-19-24a lot. He also feels stressed about his daughter. Although she was doing well during the trip to [place], she became irritable after coming back. She told him that she blames her parents for moving to [place], which might have led her brother to kill himself. He understands what she says, stating that he partly blames himself for it. He has fair sleep. He feels fatigue, depressed, although it has been improving. He has fair concentration. He denies SI. He takes valium up to twice a day for anxiety. Of note, he has been prescribed 10 mg by other doctor, although he is agreeable to taper down to 5 mg per day.   Per PMP,  Diazepam 10 Mg Tablet filled by other provider  Wt Readings from Last 3 Encounters:  03/14/18 198 lb (89.8 kg)  02/19/18 194 lb (88 kg)  02/16/18 196 lb (88.9 kg)    Visit Diagnosis:    ICD-10-CM   1. MDD (major depressive disorder), recurrent episode, moderate (HCC) F33.1     Past Psychiatric History: Please see initial evaluation for full details. I have reviewed the history. No updates at this time.     Past Medical History:  Past Medical History:  Diagnosis Date  . Anxiety   . Arthritis   . Depression    watched son committ suicide  . GERD (gastroesophageal reflux disease)   . Hypertension    no meds  . PONV (postoperative nausea and vomiting)    HAD SOME ISSUES WITH NAUSEA.- THINKS ITS  MEDICATION RELATED, NOT ANESTHESIA    Past Surgical History:  Procedure Laterality Date  . APPLICATION OF ROBOTIC ASSISTANCE FOR SPINAL PROCEDURE N/A 02/19/2018   Procedure: APPLICATION OF ROBOTIC ASSISTANCE FOR SPINAL PROCEDURE;  Surgeon: Barnett Abu, MD;  Location: [facility] OR;  Service: Neurosurgery;  Laterality: N/A;  . BACK SURGERY     X 2   . HAND SURGERY    . NECK SURGERY      Family Psychiatric History: Please see initial evaluation for full details. I have reviewed the history. No updates at this time.     Family History:  Family History  Problem Relation Age of Onset  . Anxiety disorder Mother   . Depression Father     Social History:  Social History   Socioeconomic History  . Marital status: Married    Spouse name: Not on file  . Number of children: Not on file  . Years of education: Not on file  . Highest education level: Not on file  Occupational History  . Not on file  Social Needs  . Financial resource strain: Not on file  . Food insecurity:    Worry: Not on file    Inability: Not on file  . Transportation needs:    Medical: Not on file    Non-medical: Not on file  Tobacco Use  .  Smoking status: Never Smoker  . Smokeless tobacco: Former Neurosurgeon    Types: Chew  Substance and Sexual Activity  . Alcohol use: Not Currently    Comment: occ  . Drug use: Not on file    Comment: CBD OINTMENT ON BACK  . Sexual activity: Yes    Birth control/protection: Implant  Lifestyle  . Physical activity:    Days per week: Not on file    Minutes per session: Not on file  . Stress: Not on file  Relationships  . Social connections:    Talks on phone: Not on file    Gets together: Not on file    Attends religious service: Not on file    Active member of club or organization: Not on file    Attends meetings of clubs or organizations: Not on file    Relationship status: Not on file  Other Topics Concern  . Not on file  Social History Narrative  . Not on file     Allergies: No Known Allergies  Metabolic Disorder Labs: No results found for: HGBA1C, MPG No results found for: PROLACTIN No results found for: CHOL, TRIG, HDL, CHOLHDL, VLDL, LDLCALC No results found for: TSH  Therapeutic Level Labs: No results found for: LITHIUM No results found for: VALPROATE No components found for:  CBMZ  Current Medications: Current Outpatient Medications  Medication Sig Dispense Refill  . acetaminophen (TYLENOL) 500 MG tablet Take 500 mg by mouth daily as needed for moderate pain.    . Ca Carbonate-Mag Hydroxide (ROLAIDS ANTACID ULTRA STRENGTH PO) Take 1-2 tablets by mouth at bedtime as needed (indigestion/heartburn).     . diazepam (VALIUM) 5 MG tablet Take 1 tablet (5 mg total) by mouth every 6 (six) hours as needed for muscle spasms. 40 tablet 0  . [START ON 03/28/2018] diazepam (VALIUM) 5 MG tablet Take 0.5 tablets (2.5 mg total) by mouth every 12 (twelve) hours as needed for anxiety or muscle spasms. 30 tablet 0  . DULoxetine (CYMBALTA) 60 MG capsule Take 1 capsule (60 mg total) by mouth daily. 30 capsule 0  . fluticasone (FLONASE) 50 MCG/ACT nasal spray Place 1 spray into both nostrils daily.    Marland Kitchen lisinopril-hydrochlorothiazide (PRINZIDE,ZESTORETIC) 10-12.5 MG tablet Take 1 tablet by mouth every evening.     No current facility-administered medications for this visit.      Musculoskeletal: Strength & Muscle Tone: within normal limits Gait & Station: normal Patient leans: N/A  Psychiatric Specialty Exam: Review of Systems  Psychiatric/Behavioral: Positive for depression. Negative for hallucinations, memory loss, substance abuse and suicidal ideas. The patient is nervous/anxious. The patient does not have insomnia.   All other systems reviewed and are negative.   Blood pressure 127/83, pulse 77, height 5\' 8"  (1.727 m), weight 198 lb (89.8 kg), SpO2 99 %.Body mass index is 30.11 kg/m.  General Appearance: Fairly Groomed  Eye Contact:  Good   Speech:  Clear and Coherent  Volume:  Normal  Mood:  "better"  Affect:  Appropriate, Congruent and slightly down, but more reactive  Thought Process:  Coherent  Orientation:  Full (Time, Place, and Person)  Thought Content: Logical   Suicidal Thoughts:  No  Homicidal Thoughts:  No  Memory:  Immediate;   Good  Judgement:  Good  Insight:  Good  Psychomotor Activity:  Normal  Concentration:  Concentration: Good and Attention Span: Good  Recall:  Good  Fund of Knowledge: Good  Language: Good  Akathisia:  No  Handed:  Right  AIMS (if indicated): not done  Assets:  Communication Skills Desire for Improvement  ADL's:  Intact  Cognition: WNL  Sleep:  Good   Screenings:   Assessment and Plan:  Dakota Snow is a 52 y.o. year old male with a history of depression, spondylolisthesis L4-L5, lumbar radiculopathy, who presents for follow up appointment for MDD (major depressive disorder), recurrent episode, moderate (HCC)  # MDD, moderate, recurrent without psychotic features # PTSD Patient reports occasionally worsening neurovegetative symptoms in the context of July 4th, remembering his deceased son. Other psychosocial stressors include his daughter at home.  Will uptitrate duloxetine to target neurovegetative and PTSD symptoms.  Discussed risk of hypertension.  Will continue Valium as needed for anxiety.  Validated his grief.  Discussed effective communication with his daughter.  Discussed vulnerability. He will greatly benefit from CBT; will make a referral.   Plan I have reviewed and updated plans as below 1. Increase duloxetine 60 mg daily  2. Continue valium  2.5 mg - 5 mg daily as needed for anxiety (refill after 7/24) 3. Return to clinic in one month for 30 mins 4. Referral to therapy 5. Emergency resources which includes 911, ED, suicide crisis line [phone]) are discussed.  - Will explore developmental history more as needed  The patient demonstrates the  following risk factors for suicide: Chronic risk factors for suicide include: psychiatric disorder of depression and completed suicide in a family member. Acute risk factors for suicide include: unemployment, loss (financial, interpersonal, professional) and recent discharge from inpatient psychiatry. Protective factors for this patient include: positive social support, responsibility to others (children, family), coping skills and hope for the future. Considering these factors, the overall suicide risk at this point appears to be low. Patient is appropriate for outpatient follow up. Guns were removed by his wife  The duration of this appointment visit was 30 minutes of face-to-face time with the patient.  Greater than 50% of this time was spent in counseling, explanation of  diagnosis, planning of further management, and coordination of care.  Neysa Hottereina Taylynn Easton, MD 03/14/2018, 12:15 PM

## 2018-03-22 ENCOUNTER — Ambulatory Visit (HOSPITAL_COMMUNITY): Payer: Self-pay | Admitting: Psychiatry

## 2018-04-03 NOTE — Progress Notes (Signed)
BH MD/PA/NP OP Progress Note  04/11/2018 8:58 AM Dakota Snow  MRN:  [id number]  Chief Complaint:  Chief Complaint    Depression; Follow-up; Trauma     HPI:  Patient presents for follow-up appointment for depression and PTSD.  He states that he definitely feels better since the last appointment.  He believes that physically healing from surgery helps him a lot.  He is looking forward to start PT. the patient, his wife and Zollie Scale, his therapist went for family session. "The good came out" from the session; Zollie Scale shared her feeling of depression, and he also found out that she has been doing cutting. He also notices that his wife has crying spells every morning; he believes that she had been trying to hold herself as he was a "mess." It has been challenging for him to see them that way. However, he now can see things as "opportunities," although he couldn't a few months ago. He is concerned that he is unable to see a therapist until November as that is the month Carson died. He occasionally has insomnia. He has more energy. He feels less depressed. He has fair concentration. He denies SI. He feels less anxious, tense. He denies panic attacks. He denies nightmares. He has occasional intrusive thoughts about his son, although he denies flashback. He has bene trying to take lower dose of valium before going to sleep.  Per PMP,  Diazepam filled on 03/26/2018    Visit Diagnosis:    ICD-10-CM   1. MDD (major depressive disorder), recurrent episode, moderate (HCC) F33.1     Past Psychiatric History: Please see initial evaluation for full details. I have reviewed the history. No updates at this time.     Past Medical History:  Past Medical History:  Diagnosis Date  . Anxiety   . Arthritis   . Depression    watched son committ suicide  . GERD (gastroesophageal reflux disease)   . Hypertension    no meds  . PONV (postoperative nausea and vomiting)    HAD SOME ISSUES WITH NAUSEA.- THINKS  ITS MEDICATION RELATED, NOT ANESTHESIA    Past Surgical History:  Procedure Laterality Date  . APPLICATION OF ROBOTIC ASSISTANCE FOR SPINAL PROCEDURE N/A 02/19/2018   Procedure: APPLICATION OF ROBOTIC ASSISTANCE FOR SPINAL PROCEDURE;  Surgeon: Barnett Abu, MD;  Location: [facility] OR;  Service: Neurosurgery;  Laterality: N/A;  . BACK SURGERY     X 2   . HAND SURGERY    . NECK SURGERY      Family Psychiatric History: Please see initial evaluation for full details. I have reviewed the history. No updates at this time.     Family History:  Family History  Problem Relation Age of Onset  . Anxiety disorder Mother   . Depression Father     Social History:  Social History   Socioeconomic History  . Marital status: Married    Spouse name: Not on file  . Number of children: Not on file  . Years of education: Not on file  . Highest education level: Not on file  Occupational History  . Not on file  Social Needs  . Financial resource strain: Not on file  . Food insecurity:    Worry: Not on file    Inability: Not on file  . Transportation needs:    Medical: Not on file    Non-medical: Not on file  Tobacco Use  . Smoking status: Never Smoker  . Smokeless tobacco: Former Neurosurgeon  Types: Chew  Substance and Sexual Activity  . Alcohol use: Not Currently    Comment: occ  . Drug use: Not on file    Comment: CBD OINTMENT ON BACK  . Sexual activity: Yes    Birth control/protection: Implant  Lifestyle  . Physical activity:    Days per week: Not on file    Minutes per session: Not on file  . Stress: Not on file  Relationships  . Social connections:    Talks on phone: Not on file    Gets together: Not on file    Attends religious service: Not on file    Active member of club or organization: Not on file    Attends meetings of clubs or organizations: Not on file    Relationship status: Not on file  Other Topics Concern  . Not on file  Social History Narrative  . Not on file     Allergies: No Known Allergies  Metabolic Disorder Labs: No results found for: HGBA1C, MPG No results found for: PROLACTIN No results found for: CHOL, TRIG, HDL, CHOLHDL, VLDL, LDLCALC No results found for: TSH  Therapeutic Level Labs: No results found for: LITHIUM No results found for: VALPROATE No components found for:  CBMZ  Current Medications: Current Outpatient Medications  Medication Sig Dispense Refill  . acetaminophen (TYLENOL) 500 MG tablet Take 500 mg by mouth daily as needed for moderate pain.    . Ca Carbonate-Mag Hydroxide (ROLAIDS ANTACID ULTRA STRENGTH PO) Take 1-2 tablets by mouth at bedtime as needed (indigestion/heartburn).     . diazepam (VALIUM) 5 MG tablet Take 1 tablet (5 mg total) by mouth every 6 (six) hours as needed for muscle spasms. 40 tablet 0  . [START ON 04/26/2018] diazepam (VALIUM) 5 MG tablet 2.5-5 mg daily as needed for anxiety 30 tablet 0  . DULoxetine (CYMBALTA) 60 MG capsule Take 1 capsule (60 mg total) by mouth daily. 30 capsule 1  . fluticasone (FLONASE) 50 MCG/ACT nasal spray Place 1 spray into both nostrils daily.    Marland Kitchen lisinopril-hydrochlorothiazide (PRINZIDE,ZESTORETIC) 10-12.5 MG tablet Take 1 tablet by mouth every evening.     No current facility-administered medications for this visit.      Musculoskeletal: Strength & Muscle Tone: within normal limits Gait & Station: normal Patient leans: N/A  Psychiatric Specialty Exam: Review of Systems  Psychiatric/Behavioral: Positive for depression. Negative for hallucinations, memory loss, substance abuse and suicidal ideas. The patient has insomnia. The patient is not nervous/anxious.   All other systems reviewed and are negative.   Blood pressure (!) 145/86, pulse 82, height 5\' 8"  (1.727 m), weight 201 lb (91.2 kg), SpO2 99 %.Body mass index is 30.56 kg/m.  General Appearance: Fairly Groomed  Eye Contact:  Good  Speech:  Clear and Coherent  Volume:  Normal  Mood:  "better"   Affect:  Appropriate, Congruent and less down, improving  Thought Process:  Coherent  Orientation:  Full (Time, Place, and Person)  Thought Content: Logical   Suicidal Thoughts:  No  Homicidal Thoughts:  No  Memory:  Immediate;   Good  Judgement:  Good  Insight:  Good  Psychomotor Activity:  Normal  Concentration:  Concentration: Good and Attention Span: Good  Recall:  Good  Fund of Knowledge: Good  Language: Good  Akathisia:  No  Handed:  Right  AIMS (if indicated): not done  Assets:  Communication Skills Desire for Improvement  ADL's:  Intact  Cognition: WNL  Sleep:  Fair  Screenings:   Assessment and Plan:  Meda CoffeeShane D Glanzer is a 52 y.o. year old male with a history of depression, PTSD, spondylolisthesis L4-L5, lumbar radiculopathy , who presents for follow up appointment for MDD (major depressive disorder), recurrent episode, moderate (HCC)  # MDD, moderate, recurrent without psychotic features # PTSD Patient reports overall improvement in neurovegetative symptoms and PTSD symptoms since up titration of duloxetine.  Will continue duloxetine to target depression and PTSD.  Discussed risk of hypertension.  Will continue Valium as needed for anxiety.  Discussed self compassion.  Discussed fear of vulnerability.  He will greatly benefit from CBT and also supportive therapy for grief of loss of his son.  Referral is made.   Plan I have reviewed and updated plans as below 1. Continue duloxetine 60 mg daily  2. Continue valium  2.5 mg - 5 mg daily as needed for anxiety  3. Return to clinic in two months for 30 mins - Will explore developmental history more as needed  The patient demonstrates the following risk factors for suicide: Chronic risk factors for suicide include:psychiatric disorder ofdepressionand completed suicide in a family member. Acute risk factorsfor suicide include: unemployment, loss (financial, interpersonal, professional) and recent discharge from  inpatient psychiatry. Protective factorsfor this patient include: positive social support, responsibility to others (children, family), coping skills and hope for the future. Considering these factors, the overall suicide risk at this point appears to below. Patientisappropriate for outpatient follow up. Guns were removed by his wife  The duration of this appointment visit was 30 minutes of face-to-face time with the patient.  Greater than 50% of this time was spent in counseling, explanation of  diagnosis, planning of further management, and coordination of care.  Neysa Hottereina Emarie Paul, MD 04/11/2018, 8:58 AM

## 2018-04-11 ENCOUNTER — Ambulatory Visit (INDEPENDENT_AMBULATORY_CARE_PROVIDER_SITE_OTHER): Payer: Medicare Other | Admitting: Psychiatry

## 2018-04-11 ENCOUNTER — Encounter (HOSPITAL_COMMUNITY): Payer: Self-pay | Admitting: Psychiatry

## 2018-04-11 VITALS — BP 145/86 | HR 82 | Ht 68.0 in | Wt 201.0 lb

## 2018-04-11 DIAGNOSIS — F331 Major depressive disorder, recurrent, moderate: Secondary | ICD-10-CM

## 2018-04-11 DIAGNOSIS — Z818 Family history of other mental and behavioral disorders: Secondary | ICD-10-CM

## 2018-04-11 DIAGNOSIS — Z87891 Personal history of nicotine dependence: Secondary | ICD-10-CM

## 2018-04-11 DIAGNOSIS — G4700 Insomnia, unspecified: Secondary | ICD-10-CM | POA: Diagnosis not present

## 2018-04-11 MED ORDER — DIAZEPAM 5 MG PO TABS: ORAL_TABLET | ORAL | 0 refills | Status: DC

## 2018-04-11 MED ORDER — DULOXETINE HCL 60 MG PO CPEP: 60.0000 mg | ORAL_CAPSULE | Freq: Every day | ORAL | 1 refills | Status: DC

## 2018-04-11 NOTE — Patient Instructions (Signed)
1. Continue duloxetine 60 mg daily  2. Continue valium  2.5 mg - 5 mg daily as needed for anxiety  3. Return to clinic in two months for 30 mins

## 2018-06-04 NOTE — Progress Notes (Signed)
BH MD/PA/NP OP Progress Note  06/11/2018 10:44 AM Dakota Snow  MRN:  [id number]  Chief Complaint:  Chief Complaint    Follow-up; Trauma; Depression     HPI:  Patient presents for follow-up appointment for PTSD and depression.  He states that there has been worsening in his pain and he does not feel well.  His daughter, Dakota Snow is "opening up." He talks about an episode she was trying to find her face with a T-shirt; he felt she was crying for his son. He reports that it is easier for him to talk about his son with Dakota Snow, while he might have more difficulty talking with his wife.  He states that she has been taking care of him for the past 2 years, and he now believes that it is time for his wife. He felt supported by his wife when his wife "stick it out" through many challenges. He is trying to do the same for his wife. He occasionally feels "angry" at his son, who completed suicide. He partly attributes it to his feeling of "guilt," stating that he feels like a "failure" as a parent. He also feels nervous being around with people as he feels that people are looking at him in certain way secondary to loss of his son.  He has occasional insomnia.  He feels depressed at times.  He has fair concentration.  He had passive SI; he felt his grandfather was "lucky" to be diagnosed to have a cancer, although he felt it is terrible to think that way. He believes SI has much less intense. He denies any intention or plan. He denies panic attacks. He takes valium only a few times per month.    Per PMP,  Diazepam filled on 05/02/2018   Visit Diagnosis:    ICD-10-CM   1. MDD (major depressive disorder), recurrent episode, moderate (HCC) F33.1     Past Psychiatric History: Please see initial evaluation for full details. I have reviewed the history. No updates at this time.     Past Medical History:  Past Medical History:  Diagnosis Date  . Anxiety   . Arthritis   . Depression    watched son  committ suicide  . GERD (gastroesophageal reflux disease)   . Hypertension    no meds  . PONV (postoperative nausea and vomiting)    HAD SOME ISSUES WITH NAUSEA.- THINKS ITS MEDICATION RELATED, NOT ANESTHESIA    Past Surgical History:  Procedure Laterality Date  . APPLICATION OF ROBOTIC ASSISTANCE FOR SPINAL PROCEDURE N/A 02/19/2018   Procedure: APPLICATION OF ROBOTIC ASSISTANCE FOR SPINAL PROCEDURE;  Surgeon: Barnett Abu, MD;  Location: [facility] OR;  Service: Neurosurgery;  Laterality: N/A;  . BACK SURGERY     X 2   . HAND SURGERY    . NECK SURGERY      Family Psychiatric History: Please see initial evaluation for full details. I have reviewed the history. No updates at this time.     Family History:  Family History  Problem Relation Age of Onset  . Anxiety disorder Mother   . Depression Father     Social History:  Social History   Socioeconomic History  . Marital status: Married    Spouse name: Not on file  . Number of children: Not on file  . Years of education: Not on file  . Highest education level: Not on file  Occupational History  . Not on file  Social Needs  . Financial resource strain: Not on  file  . Food insecurity:    Worry: Not on file    Inability: Not on file  . Transportation needs:    Medical: Not on file    Non-medical: Not on file  Tobacco Use  . Smoking status: Never Smoker  . Smokeless tobacco: Former Neurosurgeon    Types: Chew  Substance and Sexual Activity  . Alcohol use: Not Currently    Comment: occ  . Drug use: Not on file    Comment: CBD OINTMENT ON BACK  . Sexual activity: Yes    Birth control/protection: Implant  Lifestyle  . Physical activity:    Days per week: Not on file    Minutes per session: Not on file  . Stress: Not on file  Relationships  . Social connections:    Talks on phone: Not on file    Gets together: Not on file    Attends religious service: Not on file    Active member of club or organization: Not on file     Attends meetings of clubs or organizations: Not on file    Relationship status: Not on file  Other Topics Concern  . Not on file  Social History Narrative  . Not on file    Allergies: No Known Allergies  Metabolic Disorder Labs: No results found for: HGBA1C, MPG No results found for: PROLACTIN No results found for: CHOL, TRIG, HDL, CHOLHDL, VLDL, LDLCALC No results found for: TSH  Therapeutic Level Labs: No results found for: LITHIUM No results found for: VALPROATE No components found for:  CBMZ  Current Medications: Current Outpatient Medications  Medication Sig Dispense Refill  . acetaminophen (TYLENOL) 500 MG tablet Take 500 mg by mouth daily as needed for moderate pain.    . Ca Carbonate-Mag Hydroxide (ROLAIDS ANTACID ULTRA STRENGTH PO) Take 1-2 tablets by mouth at bedtime as needed (indigestion/heartburn).     . diazepam (VALIUM) 5 MG tablet 2.5-5 mg daily as needed for anxiety 30 tablet 0  . DULoxetine (CYMBALTA) 60 MG capsule Take 1 capsule (60 mg total) by mouth daily. 30 capsule 1  . fluticasone (FLONASE) 50 MCG/ACT nasal spray Place 1 spray into both nostrils daily.    Marland Kitchen lisinopril-hydrochlorothiazide (PRINZIDE,ZESTORETIC) 10-12.5 MG tablet Take 1 tablet by mouth every evening.     No current facility-administered medications for this visit.      Musculoskeletal: Strength & Muscle Tone: within normal limits Gait & Station: normal Patient leans: N/A  Psychiatric Specialty Exam: Review of Systems  Psychiatric/Behavioral: Positive for depression. Negative for hallucinations, memory loss, substance abuse and suicidal ideas. The patient is nervous/anxious and has insomnia.   All other systems reviewed and are negative.   Blood pressure (!) 148/94, pulse 64, height 5\' 8"  (1.727 m), weight 197 lb (89.4 kg), SpO2 98 %.Body mass index is 29.95 kg/m.  General Appearance: Fairly Groomed  Eye Contact:  Good  Speech:  Clear and Coherent  Volume:  Normal  Mood:  "good"   Affect:  Appropriate, Congruent and down at times  Thought Process:  Coherent  Orientation:  Full (Time, Place, and Person)  Thought Content: Logical   Suicidal Thoughts:  Yes.  without intent/plan  Homicidal Thoughts:  No  Memory:  Immediate;   Good  Judgement:  Good  Insight:  Good  Psychomotor Activity:  Normal  Concentration:  Concentration: Good and Attention Span: Good  Recall:  Good  Fund of Knowledge: Good  Language: Good  Akathisia:  No  Handed:  Right  AIMS (if indicated): not done  Assets:  Communication Skills Desire for Improvement  ADL's:  Intact  Cognition: WNL  Sleep:  Poor   Screenings:   Assessment and Plan:  Dakota Snow is a 52 y.o. year old male with a history of depression, PTSD, spondylolisthesis L4-L5, lumbar radiculopathy , who presents for follow up appointment for MDD (major depressive disorder), recurrent episode, moderate (HCC)  # MDD, moderate, recurrent without psychotic features # PTSD Although patient reports occasional worsening a neurovegetative symptoms and anxiety, he reports his preference to stay on current medication.  Psychosocial stressors including death of his son from suicide in Nov 2016, and back pain. Will continue duloxetine to target depression and PTSD.  Discussed risk of hypertension. Will consider adjunctive treatment for depression if any worsening in mood symptoms. Will continue Valium as needed for anxiety.  Discussed fear of vulnerability.  Validated his grief.  He is scheduled to see a therapist.   Plan I have reviewed and updated plans as below 1.Continueduloxetine 60mg  daily  2. Continue valium2.5 mg - 5 mg daily as needed for anxiety  3. Return to clinic in two months for 30 mins  I have reviewed suicide assessment in detail. No change in the following assessment.  The patient demonstrates the following risk factors for suicide: Chronic risk factors for suicide include:psychiatric disorder  ofdepressionand completed suicide in a family member. Acute risk factorsfor suicide include: unemployment, loss (financial, interpersonal, professional) and recent discharge from inpatient psychiatry. Protective factorsfor this patient include: positive social support, responsibility to others (children, family), coping skills and hope for the future. Considering these factors, the overall suicide risk at this point appears to below. Patientisappropriate for outpatient follow up. Guns were removed by his wife.   The duration of this appointment visit was 30 minutes of face-to-face time with the patient.  Greater than 50% of this time was spent in counseling, explanation of  diagnosis, planning of further management, and coordination of care.  [name], MD 06/11/2018, 10:44 AM

## 2018-06-11 ENCOUNTER — Ambulatory Visit (INDEPENDENT_AMBULATORY_CARE_PROVIDER_SITE_OTHER): Payer: Medicare Other | Admitting: Psychiatry

## 2018-06-11 ENCOUNTER — Encounter (HOSPITAL_COMMUNITY): Payer: Self-pay | Admitting: Psychiatry

## 2018-06-11 VITALS — BP 148/94 | HR 64 | Ht 68.0 in | Wt 197.0 lb

## 2018-06-11 DIAGNOSIS — G4700 Insomnia, unspecified: Secondary | ICD-10-CM

## 2018-06-11 DIAGNOSIS — F4310 Post-traumatic stress disorder, unspecified: Secondary | ICD-10-CM

## 2018-06-11 DIAGNOSIS — F419 Anxiety disorder, unspecified: Secondary | ICD-10-CM | POA: Diagnosis not present

## 2018-06-11 DIAGNOSIS — F331 Major depressive disorder, recurrent, moderate: Secondary | ICD-10-CM

## 2018-06-11 MED ORDER — DULOXETINE HCL 60 MG PO CPEP: 60.0000 mg | ORAL_CAPSULE | Freq: Every day | ORAL | 1 refills | Status: DC

## 2018-06-11 MED ORDER — DIAZEPAM 5 MG PO TABS: ORAL_TABLET | ORAL | 0 refills | Status: DC

## 2018-06-11 NOTE — Patient Instructions (Signed)
1. Continue duloxetine 60 mg daily  2. Continue valium  2.5 mg - 5 mg daily as needed for anxiety  3. Return to clinic in two months for 30 mins 

## 2018-07-09 ENCOUNTER — Encounter

## 2018-07-09 ENCOUNTER — Encounter (HOSPITAL_COMMUNITY): Payer: Self-pay | Admitting: Psychiatry

## 2018-07-09 ENCOUNTER — Ambulatory Visit (INDEPENDENT_AMBULATORY_CARE_PROVIDER_SITE_OTHER): Payer: Medicare Other | Admitting: Psychiatry

## 2018-07-09 DIAGNOSIS — F331 Major depressive disorder, recurrent, moderate: Secondary | ICD-10-CM | POA: Diagnosis not present
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
## 2018-08-09 NOTE — Progress Notes (Signed)
BH MD/PA/NP OP Progress Note  08/13/2018 10:22 AM Dakota Snow  MRN:  [id number]  Chief Complaint:  Chief Complaint    Depression; Trauma; Follow-up     HPI:  Patient presents for follow-up appointment for depression and PTSD.  He states that he has been doing better.  He tries to engage in things more; he has started to take a walk regularly.  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  Although he occasionally implies his wife to take a walk together, she does not come due to various reasons. She is more depressed lately.  He occasionally "ignores" her unless she comes to him as she reminds of him when he had struggled with severe depression.  He meets with a friend of him, who also lost the child more than 10 years ago.  He finds it very helpful that this friend invites him even when the patient is not in a good mood. He agrees to continue inviting his wife in a similar way rather than "ignoring" her, while he is validated of his struggle.  He had crying spell one time, although it used to be every time.  He tends to think about his son when they overnight of his son's room turned on.  He has middle insomnia.  He has good energy and motivation.  He feels less depressed.  He has fair concentration.  He has passive fleeting SI, although he denies any intent or plans.  He feels less anxious.  He denies panic attacks.  He has less nightmares, flashback.   [place] PMP,.  Diazepam filled on 06/28/2018   Visit Diagnosis:    ICD-10-CM   1. MDD (major depressive disorder), recurrent episode, moderate (HCC) F33.1   2. PTSD (post-traumatic stress disorder) F43.10     Past Psychiatric History: Please see initial evaluation for full details. I have reviewed the history. No updates at this time.     Past Medical History:  Past Medical History:  Diagnosis Date  . Anxiety   . Arthritis   . Depression    watched son committ suicide  . GERD (gastroesophageal reflux disease)   . Hypertension    no meds  .  PONV (postoperative nausea and vomiting)    HAD SOME ISSUES WITH NAUSEA.- THINKS ITS MEDICATION RELATED, NOT ANESTHESIA    Past Surgical History:  Procedure Laterality Date  . APPLICATION OF ROBOTIC ASSISTANCE FOR SPINAL PROCEDURE N/A 02/19/2018   Procedure: APPLICATION OF ROBOTIC ASSISTANCE FOR SPINAL PROCEDURE;  Surgeon: Barnett Abu, MD;  Location: [facility] OR;  Service: Neurosurgery;  Laterality: N/A;  . BACK SURGERY     X 2   . HAND SURGERY    . NECK SURGERY      Family Psychiatric History: Please see initial evaluation for full details. I have reviewed the history. No updates at this time.     Family History:  Family History  Problem Relation Age of Onset  . Anxiety disorder Mother   . Depression Father     Social History:  Social History   Socioeconomic History  . Marital status: Married    Spouse name: Not on file  . Number of children: Not on file  . Years of education: Not on file  . Highest education level: Not on file  Occupational History  . Not on file  Social Needs  . Financial resource strain: Not on file  . Food insecurity:    Worry: Not on file    Inability: Not on file  .  Transportation needs:    Medical: Not on file    Non-medical: Not on file  Tobacco Use  . Smoking status: Former Smoker    Years: 1.00    Last attempt to quit: 07/09/1988    Years since quitting: 30.1  . Smokeless tobacco: Former NeurosurgeonUser    Types: Chew    Quit date: 11/20/2017  Substance and Sexual Activity  . Alcohol use: Not Currently    Comment: occ  . Drug use: Not on file    Comment: CBD OINTMENT ON BACK  . Sexual activity: Yes    Birth control/protection: Implant  Lifestyle  . Physical activity:    Days per week: Not on file    Minutes per session: Not on file  . Stress: Not on file  Relationships  . Social connections:    Talks on phone: Not on file    Gets together: Not on file    Attends religious service: Not on file    Active member of club or organization: Not  on file    Attends meetings of clubs or organizations: Not on file    Relationship status: Not on file  Other Topics Concern  . Not on file  Social History Narrative  . Not on file    Allergies: No Known Allergies  Metabolic Disorder Labs: No results found for: HGBA1C, MPG No results found for: PROLACTIN No results found for: CHOL, TRIG, HDL, CHOLHDL, VLDL, LDLCALC No results found for: TSH  Therapeutic Level Labs: No results found for: LITHIUM No results found for: VALPROATE No components found for:  CBMZ  Current Medications: Current Outpatient Medications  Medication Sig Dispense Refill  . acetaminophen (TYLENOL) 500 MG tablet Take 500 mg by mouth daily as needed for moderate pain.    . Ca Carbonate-Mag Hydroxide (ROLAIDS ANTACID ULTRA STRENGTH PO) Take 1-2 tablets by mouth at bedtime as needed (indigestion/heartburn).     . diazepam (VALIUM) 5 MG tablet 2.5-5 mg daily as needed for anxiety 30 tablet 0  . DULoxetine (CYMBALTA) 60 MG capsule Take 1 capsule (60 mg total) by mouth daily. 90 capsule 0  . fluticasone (FLONASE) 50 MCG/ACT nasal spray Place 1 spray into both nostrils daily.    Marland Kitchen. lisinopril-hydrochlorothiazide (PRINZIDE,ZESTORETIC) 10-12.5 MG tablet Take 1 tablet by mouth every evening.     No current facility-administered medications for this visit.      Musculoskeletal: Strength & Muscle Tone: within normal limits Gait & Station: normal Patient leans: N/A  Psychiatric Specialty Exam: Review of Systems  Psychiatric/Behavioral: Positive for depression and suicidal ideas. Negative for hallucinations, memory loss and substance abuse. The patient is nervous/anxious and has insomnia.   All other systems reviewed and are negative.   Blood pressure 130/82, pulse 67, height 5\' 8"  (1.727 m), weight 196 lb (88.9 kg), SpO2 99 %.Body mass index is 29.8 kg/m.  General Appearance: Fairly Groomed  Eye Contact:  Good  Speech:  Clear and Coherent  Volume:  Normal   Mood:  "better"  Affect:  Appropriate, Congruent and down at times, but reactive  Thought Process:  Coherent  Orientation:  Full (Time, Place, and Person)  Thought Content: Logical   Suicidal Thoughts:  Yes.  without intent/plan  Homicidal Thoughts:  No  Memory:  Immediate;   Good  Judgement:  Good  Insight:  Good  Psychomotor Activity:  Normal  Concentration:  Concentration: Good and Attention Span: Good  Recall:  Good  Fund of Knowledge: Good  Language: Good  Akathisia:  No  Handed:  Right  AIMS (if indicated): not done  Assets:  Communication Skills Desire for Improvement  ADL's:  Intact  Cognition: WNL  Sleep:  Poor   Screenings:   Assessment and Plan:  LASHAWN ORREGO is a 52 y.o. year old male with a history of depression, PTSD,   spondylolisthesis L4-L5, lumbar radiculopathy, who presents for follow up appointment for MDD (major depressive disorder), recurrent episode, moderate (HCC)  PTSD (post-traumatic stress disorder)  # MDD, moderate, recurrent without psychotic features # PTSD There has been steady improvement in depressive symptoms and PTSD symptoms since the last visit.  Psychosocial stressors including death of his son from suicide on November 2016.  Will continue duloxetine to target depression and PTSD.  Will continue Valium as needed for anxiety.  Discussed behavioral activation while validating his grief.  He is encouraged to continue to see a therapist.   Plan I have reviewed and updated plans as below 1.Continueduloxetine 60mg  daily  2. Continue valium2.5 mg - 5 mg daily as needed for anxiety(he declines refill) 3. Return to clinic in two months for 30 mins  I have reviewed suicide assessment in detail. No change in the following assessment.   The patient demonstrates the following risk factors for suicide: Chronic risk factors for suicide include:psychiatric disorder ofdepressionand completed suicide in a family member. Acute risk  factorsfor suicide include: unemployment, loss (financial, interpersonal, professional) and recent discharge from inpatient psychiatry. Protective factorsfor this patient include: positive social support, responsibility to others (children, family), coping skills and hope for the future. Considering these factors, the overall suicide risk at this point appears to below. Patientisappropriate for outpatient follow up. Guns were removed by his wife.   The duration of this appointment visit was 30 minutes of face-to-face time with the patient.  Greater than 50% of this time was spent in counseling, explanation of  diagnosis, planning of further management, and coordination of care.  [name], MD 08/13/2018, 10:22 AM

## 2018-08-13 ENCOUNTER — Ambulatory Visit (INDEPENDENT_AMBULATORY_CARE_PROVIDER_SITE_OTHER): Payer: Medicare Other | Admitting: Psychiatry

## 2018-08-13 ENCOUNTER — Encounter (HOSPITAL_COMMUNITY): Payer: Self-pay | Admitting: Psychiatry

## 2018-08-13 VITALS — BP 130/82 | HR 67 | Ht 68.0 in | Wt 196.0 lb

## 2018-08-13 DIAGNOSIS — F331 Major depressive disorder, recurrent, moderate: Secondary | ICD-10-CM | POA: Diagnosis not present

## 2018-08-13 DIAGNOSIS — F4310 Post-traumatic stress disorder, unspecified: Secondary | ICD-10-CM

## 2018-08-13 MED ORDER — DULOXETINE HCL 60 MG PO CPEP: 60.0000 mg | ORAL_CAPSULE | Freq: Every day | ORAL | 0 refills | Status: DC

## 2018-08-13 NOTE — Patient Instructions (Addendum)
1.Continueduloxetine 60mg  daily  2. Continue valium2.5 mg - 5 mg daily as needed for anxiety 3. Return to clinic in three months for 15 mins

## 2018-08-17 ENCOUNTER — Encounter

## 2018-08-17 ENCOUNTER — Ambulatory Visit (HOSPITAL_COMMUNITY): Payer: Medicare Other | Admitting: Psychiatry

## 2018-09-04 ENCOUNTER — Ambulatory Visit (INDEPENDENT_AMBULATORY_CARE_PROVIDER_SITE_OTHER): Payer: Medicare Other | Admitting: Psychiatry

## 2018-09-04 DIAGNOSIS — F331 Major depressive disorder, recurrent, moderate: Secondary | ICD-10-CM

## 2018-09-04 NOTE — Progress Notes (Signed)
   THERAPIST PROGRESS NOTE  Session Time: Tuesday 09/04/2018 3:12 PM - 4:00 PM  Participation Level: Active  Behavioral Response: CasualAlertDepressed  Type of Therapy: Individual Therapy  Treatment Goals addressed: Establish rapport, began healthy grieving process  Interventions: Supportive  Summary: Dakota CoffeeShane D Snow is a 52 y.o. male who is referred for services by psychiatrist Dr. Vanetta ShawlHisada due to experiencing symptoms of depression. Patient reports 1 psychiatric hos4pitalizatioin due to depression and suicidal thoughts. Patient reports attending grief counseling in [place] with [place] prior to hospitalization.  Patient reports beginning to suffer with depression while a teenager. He says he began experiencing anxiety when his 50 yo son died by suicide on July 09, 2015. He reports experiencing thoughts of things are not going to get any better, isolation, closing self off, becoming really sad, crying a lot, anger, anxiety and panic attacks. Patient also reports having images of son's death daily.   Patient last was seen 2 months ago. He states having ups and downs. He says the holidays have been difficult. He has begun walking daily and says this has helped. He also has been talking to a friend whose son died by suicide 10 years ago. He remains involved with family and friends.  He remains emotional and says he still has problems with anger.   Suicidal/Homicidal: Nowithout intent/plan Patient reports thoughts of being better off dead but denies any plans or intent to harm self. He agrees to call this practice, call 911, or have someone take him to the ER should symptoms worsen.   Therapist Response: Established rapport, reviewed symptoms, administered PHQ - 9, praised and reinforced patient's increased behavioral activation, assisted patient identify effects on mood/behavior, discussed using daily planning to promote consistent behavioral activation, facilitated expression and thoughts of  feelings, validated feelings, began to explore goals and next steps for treatment, discussed the grieving process and provided patient with handout to review, discussed how avoidance may affect grieving process and depression.  Plan: Return again in 2 weeks.  Diagnosis: Axis I: MDD, recurrent, moderate    Axis II: No diagnosis    Tajuanna Burnett, LCSW 09/04/2018

## 2018-09-25 ENCOUNTER — Encounter (HOSPITAL_COMMUNITY): Payer: Self-pay | Admitting: Psychiatry

## 2018-09-25 ENCOUNTER — Ambulatory Visit (INDEPENDENT_AMBULATORY_CARE_PROVIDER_SITE_OTHER): Payer: Medicare Other | Admitting: Psychiatry

## 2018-09-25 DIAGNOSIS — F331 Major depressive disorder, recurrent, moderate: Secondary | ICD-10-CM | POA: Diagnosis not present

## 2018-09-25 NOTE — Progress Notes (Signed)
   THERAPIST PROGRESS NOTE  Session Time: Tuesday 09/25/2018 10:00 AM - 11:15 AM    Participation Level: Active  Behavioral Response: CasualAlertDepressed/tearful at times  Type of Therapy: Individual Therapy  Treatment Goals addressed: Resolve feelings of guilt and regret related to loss, accept loss, reengage in activities   Interventions: Supportive  Summary: Dakota Snow is a 53 y.o. male who is referred for services by psychiatrist Dr. Vanetta Shawl due to experiencing symptoms of depression. Patient reports 1 psychiatric hos4pitalizatioin due to depression and suicidal thoughts. Patient reports attending grief counseling in [place] with his wife prior to hospitalization.  Patient reports beginning to suffer with depression while a teenager. He says he began experiencing anxiety when his 18 yo son died by suicide on 06-Aug-2015. He reports experiencing thoughts of things are not going to get any better, isolation, closing self off, becoming really sad, crying a lot, anger, anxiety and panic attacks. Patient also reports having images of son's death daily.   Patient last was seen 3 weeks ago. He states last 3 weeks have been rough and staying in bed a couple of days as he felt more depressed. He says he had fleeting passive SI with no intent or plan to harm self. He continues to deny plan or intent to harm self.  He reports having more thoughts about son since session. He reports  eventually telling self he had to resume walking and did so. He then became involved in other activities and reports feeling better. He still is experiencing difficulty accepting loss of son and reports feelings of guilt and regret. He also reports continued pattern of avoiding feelings associated with loss.   Suicidal/Homicidal: Nowithout intent/planHe agrees to call this practice, call 911, or have someone take him to the ER should symptoms worsen.   Therapist Response reviewed symptoms, praised and reinforced  patient's recognition of this thoughts and behaviors, praised and reinforced his efforts in resuming activity, discussed effects, developed treatment plan, did more assessment exploring loss, social support, other stressors, and background, discussed rationale for and practiced grounding techniques (deep breathing, naming objects in the room), began to assist patient identify ways he has avoided feelings associated with loss  Plan: Return again in 2 weeks.  Diagnosis: Axis I: MDD, recurrent, moderate    Axis II: No diagnosis    Adah Salvage, LCSW 09/25/2018

## 2018-10-04 ENCOUNTER — Telehealth (HOSPITAL_COMMUNITY): Payer: Self-pay | Admitting: *Deleted

## 2018-10-04 ENCOUNTER — Other Ambulatory Visit (HOSPITAL_COMMUNITY): Payer: Self-pay | Admitting: Psychiatry

## 2018-10-04 MED ORDER — DIAZEPAM 5 MG PO TABS: ORAL_TABLET | ORAL | 1 refills | Status: DC

## 2018-10-04 NOTE — Telephone Encounter (Signed)
ordered

## 2018-10-04 NOTE — Telephone Encounter (Signed)
Dr Vanetta Shawl Patient called stating that he was told to call & let you know how the Valium  5 mg worked for him. He says it's working well & requested a refill

## 2018-10-10 ENCOUNTER — Ambulatory Visit (INDEPENDENT_AMBULATORY_CARE_PROVIDER_SITE_OTHER): Payer: Medicare Other | Admitting: Psychiatry

## 2018-10-10 ENCOUNTER — Encounter (HOSPITAL_COMMUNITY): Payer: Self-pay | Admitting: Psychiatry

## 2018-10-10 DIAGNOSIS — F331 Major depressive disorder, recurrent, moderate: Secondary | ICD-10-CM | POA: Diagnosis not present

## 2018-10-10 NOTE — Progress Notes (Signed)
   THERAPIST PROGRESS NOTE  Session Time: Wednesday 10/10/2018 2:00 PM -  3:00 PM                                                             Participation Level: Active  Behavioral Response: CasualAlertDepressed/tearful at times  Type of Therapy: Individual Therapy  Treatment Goals addressed: Resolve feelings of guilt and regret related to loss, accept loss, reengage in activities   Interventions: Supportive, grief therapy  Summary: ZYEL LAURIANO is a 53 y.o. male who is referred for services by psychiatrist Dr. Vanetta Shawl due to experiencing symptoms of depression. Patient reports 1 psychiatric hos4pitalizatioin due to depression and suicidal thoughts. Patient reports attending grief counseling in [place] with his wife prior to hospitalization.  Patient reports beginning to suffer with depression while a teenager. He says he began experiencing anxiety when his 61 yo son died by suicide on 2015-07-27. He reports experiencing thoughts of things are not going to get any better, isolation, closing self off, becoming really sad, crying a lot, anger, anxiety and panic attacks. Patient also reports having images of son's death daily.   Patient last was seen 2 weeks ago. He states last week was rough. He reports learning the father of an acquaintance who knew his son and died by suicide 4 weeks after patient's son died by suicide had a heart attack and died. Patient also reports overdoing it helping a friend paint and then being unable to go out and do anything for a couple of days. During that time, he reports becoming more depressed and having fleeting suicidal thoughts. He also experienced desire to cut but says he refrained self. He says he wouldn't do anything to harm self due to his wife and daughter. Patient reports he drank alcoholic beverages  twice last week to try to cope with emotional pain.He resumed walking today and also had lunch with a supportive friend. Patient continues to experience  guilt, regret regarding loss of son. He also still experiences anxiety. He reports stress associated with daughter as he worries about way she is coping with her brother's death. She is attending therapy. He is pleased communication is improving in relationship with wife,   Suicidal/Homicidal: Nowithout intent/planHe agrees to call this practice, call 911, or have someone take him to the ER should symptoms worsen.   Therapist Response reviewed symptoms, praised and reinforced patient's efforts to attend therapy, praised and reinforced his efforts in resuming activity, discussed effects, assessed factors that have happened or happening now to make things harder and easier for patient, discussed other losses patient has experienced and assisted patient identify his pattern of coping,  assisted patient identify ways he has avoided feelings associated with loss, discussed how avoidance contributes to complicated grief and depression, used mindfulness technique to assist patient approach his pain rather than avoid, processed experience, discussed how death of son has affected patient's family members and family interaction.  Plan: Return again in 2 weeks.  Diagnosis: Axis I: MDD, recurrent, moderate    Axis II: No diagnosis    Adah Salvage, LCSW 10/10/2018

## 2018-10-24 ENCOUNTER — Ambulatory Visit (INDEPENDENT_AMBULATORY_CARE_PROVIDER_SITE_OTHER): Payer: Medicare Other | Admitting: Psychiatry

## 2018-10-24 ENCOUNTER — Encounter (HOSPITAL_COMMUNITY): Payer: Self-pay | Admitting: Psychiatry

## 2018-10-24 DIAGNOSIS — F331 Major depressive disorder, recurrent, moderate: Secondary | ICD-10-CM

## 2018-10-24 NOTE — Progress Notes (Signed)
   THERAPIST PROGRESS NOTE  Session Time: Wednesday 10/24/2018 11:10 AM - 12:15 PM                                                             Participation Level: Active  Behavioral Response: CasualAlertDepressed/tearful at times  Type of Therapy: Individual Therapy  Treatment Goals addressed: Resolve feelings of guilt and regret related to loss, accept loss, reengage in activities   Interventions: Supportive, grief therapy  Summary: Dakota Snow is a 53 y.o. male who is referred for services by psychiatrist Dr. Vanetta Shawl due to experiencing symptoms of depression. Patient reports 1 psychiatric hos4pitalizatioin due to depression and suicidal thoughts. Patient reports attending grief counseling in [place] with his wife prior to hospitalization.  Patient reports beginning to suffer with depression while a teenager. He says he began experiencing anxiety when his 46 yo son died by suicide on 19-Jul-2015. He reports experiencing thoughts of things are not going to get any better, isolation, closing self off, becoming really sad, crying a lot, anger, anxiety and panic attacks. Patient also reports having images of son's death daily.   Patient last was seen 2 weeks ago. He states past two weeks have not been very good as he has been experiencing increased back pain. This evoked thoughts and fears of having another back surgery.  He reports becoming more depressed, decreased involvement in activity, and fleeting suicidal ideations. He says he would not do anything to harm self due to his daughter. He has had more resentment and frustration about marital issues. He shares he has been listening to very sad music a lot.    Suicidal/Homicidal: Nowithout intent/planHe agrees to call this practice, call 911, or have someone take him to the ER should symptoms worsen. Patient is provided with crisis contact card. .   Therapist Response reviewed symptoms, praised and reinforced patient's efforts to attend  therapy, praised and reinforced his efforts in resuming activity, assisted patient identify triggers of increased depressed mood, facilitated patient expressing thoughts and feelings, validated feelings, assisted patient develop strategies to resist suicidal thoughts and feelings should these thoughts and feelings emerge, assisted patient develop plan to engage regularly in helpful activities (walking, contact with friends, listen to inspirational podcast, and avoid listening to sad music) Plan: Return again in 2 weeks.  Diagnosis: Axis I: MDD, recurrent, moderate    Axis II: No diagnosis    Adah Salvage, LCSW 10/24/2018

## 2018-11-07 ENCOUNTER — Ambulatory Visit (INDEPENDENT_AMBULATORY_CARE_PROVIDER_SITE_OTHER): Payer: Medicare Other | Admitting: Psychiatry

## 2018-11-07 DIAGNOSIS — F331 Major depressive disorder, recurrent, moderate: Secondary | ICD-10-CM | POA: Diagnosis not present

## 2018-11-07 NOTE — Progress Notes (Signed)
BH MD/PA/NP OP Progress Note  11/12/2018 10:15 AM Dakota Snow  MRN:  [id number]  Chief Complaint:  Chief Complaint    Follow-up; Depression     HPI:  Patient presents for follow-up appointment for depression and PTSD.  He states that he has been trying to make better choices.  Although there was a time he felt depressed especially around the holiday, he was able to allow him having time to feel that way, while he would go back on track the next day.  He also reports worsening in his pain, and was seen by his pain doctor.  His anxiety is aggravated by worsening in pain.  However, he is trying to see things in a "realistic way."  He enjoyed going to [place] and attended the concert with his son's friend.  Although it initially triggered his anxiety, he had a good time. He talks about his wife, who appears to feel the same way he used to feel last year.  She may stay in the bed almost all day.  He has insomnia, which she mainly attributes to pain.  He has more motivation and energy.  He had fleeting passive SI, although he denies intent or plan.  He feels anxious and tense at times.  He denies panic attacks.   Diazepam filled on 11/06/2018   Visit Diagnosis:    ICD-10-CM   1. MDD (major depressive disorder), recurrent episode, mild (HCC) F33.0     Past Psychiatric History: Please see initial evaluation for full details. I have reviewed the history. No updates at this time.     Past Medical History:  Past Medical History:  Diagnosis Date  . Anxiety   . Arthritis   . Depression    watched son committ suicide  . GERD (gastroesophageal reflux disease)   . Hypertension    no meds  . PONV (postoperative nausea and vomiting)    HAD SOME ISSUES WITH NAUSEA.- THINKS ITS MEDICATION RELATED, NOT ANESTHESIA    Past Surgical History:  Procedure Laterality Date  . APPLICATION OF ROBOTIC ASSISTANCE FOR SPINAL PROCEDURE N/A 02/19/2018   Procedure: APPLICATION OF ROBOTIC ASSISTANCE FOR SPINAL  PROCEDURE;  Surgeon: Barnett Abu, MD;  Location: [facility] OR;  Service: Neurosurgery;  Laterality: N/A;  . BACK SURGERY     X 2   . HAND SURGERY    . NECK SURGERY      Family Psychiatric History: Please see initial evaluation for full details. I have reviewed the history. No updates at this time.     Family History:  Family History  Problem Relation Age of Onset  . Anxiety disorder Mother   . Depression Father     Social History:  Social History   Socioeconomic History  . Marital status: Married    Spouse name: Not on file  . Number of children: Not on file  . Years of education: Not on file  . Highest education level: Not on file  Occupational History  . Not on file  Social Needs  . Financial resource strain: Not on file  . Food insecurity:    Worry: Not on file    Inability: Not on file  . Transportation needs:    Medical: Not on file    Non-medical: Not on file  Tobacco Use  . Smoking status: Former Smoker    Years: 1.00    Last attempt to quit: 07/09/1988    Years since quitting: 30.3  . Smokeless tobacco: Former Neurosurgeon  Types: Dorna Bloom    Quit date: 11/20/2017  Substance and Sexual Activity  . Alcohol use: Not Currently    Comment: occ  . Drug use: Not on file    Comment: CBD OINTMENT ON BACK  . Sexual activity: Yes    Birth control/protection: Implant  Lifestyle  . Physical activity:    Days per week: Not on file    Minutes per session: Not on file  . Stress: Not on file  Relationships  . Social connections:    Talks on phone: Not on file    Gets together: Not on file    Attends religious service: Not on file    Active member of club or organization: Not on file    Attends meetings of clubs or organizations: Not on file    Relationship status: Not on file  Other Topics Concern  . Not on file  Social History Narrative  . Not on file    Allergies: No Known Allergies  Metabolic Disorder Labs: No results found for: HGBA1C, MPG No results found for:  PROLACTIN No results found for: CHOL, TRIG, HDL, CHOLHDL, VLDL, LDLCALC No results found for: TSH  Therapeutic Level Labs: No results found for: LITHIUM No results found for: VALPROATE No components found for:  CBMZ  Current Medications: Current Outpatient Medications  Medication Sig Dispense Refill  . acetaminophen (TYLENOL) 500 MG tablet Take 500 mg by mouth daily as needed for moderate pain.    . Ca Carbonate-Mag Hydroxide (ROLAIDS ANTACID ULTRA STRENGTH PO) Take 1-2 tablets by mouth at bedtime as needed (indigestion/heartburn).     . diazepam (VALIUM) 5 MG tablet 2.5-5 mg daily as needed for anxiety 30 tablet 1  . DULoxetine (CYMBALTA) 60 MG capsule Take 1 capsule (60 mg total) by mouth daily. 90 capsule 0  . fluticasone (FLONASE) 50 MCG/ACT nasal spray Place 1 spray into both nostrils daily.    Marland Kitchen lisinopril-hydrochlorothiazide (PRINZIDE,ZESTORETIC) 10-12.5 MG tablet Take 1 tablet by mouth every evening.     No current facility-administered medications for this visit.      Musculoskeletal: Strength & Muscle Tone: within normal limits Gait & Station: normal Patient leans: N/A  Psychiatric Specialty Exam: Review of Systems  Psychiatric/Behavioral: Positive for depression and suicidal ideas. Negative for hallucinations, memory loss and substance abuse. The patient is nervous/anxious and has insomnia.   All other systems reviewed and are negative.   Blood pressure 122/75, pulse 60, height 5\' 8"  (1.727 m), weight 192 lb (87.1 kg), SpO2 99 %.Body mass index is 29.19 kg/m.  General Appearance: Fairly Groomed  Eye Contact:  Good  Speech:  Clear and Coherent  Volume:  Normal  Mood:  "good"  Affect:  Appropriate, Congruent and reactive  Thought Process:  Coherent  Orientation:  Full (Time, Place, and Person)  Thought Content: Logical   Suicidal Thoughts:  No   Homicidal Thoughts:  No  Memory:  Immediate;   Good  Judgement:  Good  Insight:  Good  Psychomotor Activity:   Normal  Concentration:  Concentration: Good and Attention Span: Good  Recall:  Good  Fund of Knowledge: Good  Language: Good  Akathisia:  No  Handed:  Right  AIMS (if indicated): not done  Assets:  Communication Skills Desire for Improvement  ADL's:  Intact  Cognition: WNL  Sleep:  Poor   Screenings: PHQ2-9     Counselor from 09/04/2018 in [facility]  PHQ-2 Total Score  4  PHQ-9 Total Score  21  Assessment and Plan:  Dakota Snow is a 53 y.o. year old male with a history of depression, PTSD, spondylolisthesis L4-L5, lumbar radiculopathy, who presents for follow up appointment for MDD (major depressive disorder), recurrent episode, mild (HCC)  # MDD, mild, recurrent without psychotic features # PTSD Although patient reports occasional mood symptoms, it has been improving overall since the last visit.  Psychosocial stressors include death of his son from suicide on November 2016.  Will continue duloxetine to target depression and PTSD.  Will continue Valium as needed for anxiety.  Discussed risk of dependence and oversedation.  Discussed cognitive defusion and explored value congruent action he can take.   Plan I have reviewed and updated plans as below 1.Continueduloxetine  daily  2. Continue valium2.5 mg - 5 mg daily as needed for anxiety(he declines refill) 3. Return to clinic in three months for 30 mins  I have reviewed suicide assessment in detail. No change in the following assessment.   The patient demonstrates the following risk factors for suicide: Chronic risk factors for suicide include:psychiatric disorder ofdepressionand completed suicide in a family member. Acute risk factorsfor suicide include: unemployment, loss (financial, interpersonal, professional) and recent discharge from inpatient psychiatry. Protective factorsfor this patient include: positive social support, responsibility to others  (children, family), coping skills and hope for the future. Considering these factors, the overall suicide risk at this point appears to below. Patientisappropriate for outpatient follow up. Guns were removed by his wife.  The duration of this appointment visit was 25 minutes of face-to-face time with the patient.  Greater than 50% of this time was spent in counseling, explanation of  diagnosis, planning of further management, and coordination of care.  [name], MD 11/12/2018, 10:15 AM

## 2018-11-07 NOTE — Progress Notes (Signed)
   THERAPIST PROGRESS NOTE  Session Time: Wednesday 11/06/2018 10:15 AM -  11:00 AM                                                                 Participation Level: Active  Behavioral Response: CasualAlert/less depressed  Type of Therapy: Individual Therapy  Treatment Goals addressed: Resolve feelings of guilt and regret related to loss, accept loss, reengage in activities   Interventions: Supportive, grief therapy  Summary: Dakota Snow is a 53 y.o. male who is referred for services by psychiatrist Dr. Vanetta Shawl due to experiencing symptoms of depression. Patient reports 1 psychiatric hos4pitalizatioin due to depression and suicidal thoughts. Patient reports attending grief counseling in [place] with his wife prior to hospitalization.  Patient reports beginning to suffer with depression while a teenager. He says he began experiencing anxiety when his 18 yo son died by suicide on 2015-07-18. He reports experiencing thoughts of things are not going to get any better, isolation, closing self off, becoming really sad, crying a lot, anger, anxiety and panic attacks. Patient also reports having images of son's death daily.   Patient last was seen 2 weeks ago. He reports experiencing increased physical pain for the past two weeks and says his neck has been locked up. He says there were days where he couldn't do anything. He reports this triggered depressive thoughts and fleeting SI but states thoughts were not as frequent or intense. He also reports his 63 yo grandfather died due to cancer this past 2023-01-04. He expresses sadness but reports managing okay as grandfather lived a long life and patient said  goodbyes when visiting him in October 2019. He makes comments about how grandfather's death was so different from his son's death. He says he has avoided listening to sad music and has bee listening to inspirational podcasts. He also has maintained contact with friends.    Suicidal/Homicidal:  Nowithout intent/planHe agrees to call this practice, call 911, or have someone take him to the ER should symptoms worsen.   Therapist Response reviewed symptoms, praised and reinforced patient's efforts to engage regularly in helpful activities and avoid listening to sad music, discussed effects, validated and normalized feelings related to death of grandfather, facilitated patient sharing thoughts and feelings about son dying by suicide, discussed experiences and feelings of being disenfranchised, assisted patient identify/challenge/ and replace negative assumptions about others viewing him as a failure with healthy rational alternative, introduced and began to discuss stages of grief, provided patient with handouts to review for next session  Plan: Return again in 2 weeks.  Diagnosis: Axis I: MDD, recurrent, moderate    Axis II: No diagnosis    Adah Salvage, LCSW 11/07/2018

## 2018-11-12 ENCOUNTER — Ambulatory Visit (INDEPENDENT_AMBULATORY_CARE_PROVIDER_SITE_OTHER): Payer: Medicare Other | Admitting: Psychiatry

## 2018-11-12 VITALS — BP 122/75 | HR 60 | Ht 68.0 in | Wt 192.0 lb

## 2018-11-12 DIAGNOSIS — F330 Major depressive disorder, recurrent, mild: Secondary | ICD-10-CM | POA: Diagnosis not present

## 2018-11-12 MED ORDER — DULOXETINE HCL 60 MG PO CPEP: 60.0000 mg | ORAL_CAPSULE | Freq: Every day | ORAL | 0 refills | Status: DC

## 2018-11-12 NOTE — Patient Instructions (Signed)
1.Continueduloxetine 60mg  daily  2. Continue valium2.5 mg - 5 mg daily as needed for anxiety 3. Return to clinic in three months for 30 mins

## 2018-11-21 ENCOUNTER — Ambulatory Visit (HOSPITAL_COMMUNITY): Payer: Medicare Other | Admitting: Psychiatry

## 2018-12-05 ENCOUNTER — Other Ambulatory Visit: Payer: Self-pay

## 2018-12-05 ENCOUNTER — Ambulatory Visit (HOSPITAL_COMMUNITY): Payer: Medicare Other | Admitting: Psychiatry

## 2018-12-07 ENCOUNTER — Other Ambulatory Visit: Payer: Self-pay

## 2018-12-07 ENCOUNTER — Ambulatory Visit (INDEPENDENT_AMBULATORY_CARE_PROVIDER_SITE_OTHER): Payer: Medicare Other | Admitting: Psychiatry

## 2018-12-07 DIAGNOSIS — F331 Major depressive disorder, recurrent, moderate: Secondary | ICD-10-CM | POA: Diagnosis not present

## 2018-12-07 NOTE — Progress Notes (Signed)
Virtual Visit via Telephone Note  I connected with Dakota Snow on 12/07/18 at 11:00 AM EDT by telephone and verified that I am speaking with the correct person using two identifiers.   I discussed the limitations, risks, security and privacy concerns of performing an evaluation and management service by telephone and the availability of in person appointments. I also discussed with the patient that there may be a patient responsible charge related to this service. The patient expressed understanding and agreed to proceed.  I provided 50 minutes of non-face-to-face time during this encounter.   Dakota Salvage, LCSW   THERAPIST PROGRESS NOTE  Session Time: Friday  12/07/2018 11:00 AM - 11:50 AM                                                                Participation Level: Active  Behavioral Response: CasualAlert/less depressed  Type of Therapy: Individual Therapy  Treatment Goals addressed: Resolve feelings of guilt and regret related to loss, accept loss, reengage in activities   Interventions: Supportive, grief therapy  Summary: Dakota Snow is a 53 y.o. male who is referred for services by psychiatrist Dr. Vanetta Shawl due to experiencing symptoms of depression. Patient reports 1 psychiatric hos4pitalizatioin due to depression and suicidal thoughts. Patient reports attending grief counseling in [place] with his wife prior to hospitalization.  Patient reports beginning to suffer with depression while a teenager. He says he began experiencing anxiety when his 3 yo son died by suicide on 07-30-15. He reports experiencing thoughts of things are not going to get any better, isolation, closing self off, becoming really sad, crying a lot, anger, anxiety and panic attacks. Patient also reports having images of son's death daily.   Patient last was seen 4 weeks ago. He reports adjusting to impact of COVID-19. He reports positive impact related to he and wife having more time to talk  and having good conversations. He is very pleased about this.  He has been trying to go outside at him home and has been walking regularly. He reports increased contact with his biological family via telephone and maintaining contact with his friend via phone. He reports recently having flashbacks and anxiety about son's suicide triggered by recent contact with son's friend who was in the room at the time of the suicide. He reports staying up most of the night but says he was better the next day. He reports negative thoughts and feelings have not been as frequent or as intense.    Suicidal/Homicidal: Nowithout intent/planHe agrees to call this practice, call 911, or have someone take him to the ER should symptoms worsen.   Therapist Response reviewed symptoms, praised and reinforced patient's efforts to engage regularly in helpful activities, facilitated patient expressing thoughts and feelings about contact with son's friend and the effects on patient, validated feelings,  discussed grounding techniques patient can to use to cope with flashbacks, discussed how patient is trying to acknowledge/accept rather than avoid pain about son's death, discussed effects, assigned patient to select 3 objects that remind him of his son and have those available for next session, Plan: Return again in 2 weeks.  Diagnosis: Axis I: MDD, recurrent, moderate    Axis II: No diagnosis    Dakota Mallo E Ridley Dileo,  LCSW 12/07/2018

## 2018-12-21 ENCOUNTER — Other Ambulatory Visit: Payer: Self-pay

## 2018-12-21 ENCOUNTER — Ambulatory Visit (INDEPENDENT_AMBULATORY_CARE_PROVIDER_SITE_OTHER): Payer: Medicare Other | Admitting: Psychiatry

## 2018-12-21 DIAGNOSIS — F331 Major depressive disorder, recurrent, moderate: Secondary | ICD-10-CM

## 2018-12-21 NOTE — Progress Notes (Signed)
Virtual Visit via Telephone Note  I connected with Dakota Snow on 12/21/18 at 11:00 AM EDT by telephone and verified that I am speaking with the correct person using two identifiers.   I discussed the limitations, risks, security and privacy concerns of performing an evaluation and management service by telephone and the availability of in person appointments. I also discussed with the patient that there may be a patient responsible charge related to this service. The patient expressed understanding and agreed to proceed.  I provided 50 minutes of non-face-to-face time during this encounter.   Adah Salvage, LCSW   THERAPIST PROGRESS NOTE  Session Time: Friday  12/21/2018 11:00 AM - 11:55 AM                                                                Participation Level: Active  Behavioral Response: CasualAlert/less depressed  Type of Therapy: Individual Therapy  Treatment Goals addressed: Resolve feelings of guilt and regret related to loss, accept loss, reengage in activities   Interventions: Supportive, grief therapy  Summary: Dakota Snow is a 53 y.o. male who is referred for services by psychiatrist Dr. Vanetta Shawl due to experiencing symptoms of depression. Patient reports 1 psychiatric hos4pitalizatioin due to depression and suicidal thoughts. Patient reports attending grief counseling in [place] with his wife prior to hospitalization.  Patient reports beginning to suffer with depression while a teenager. He says he began experiencing anxiety when his 2 yo son died by suicide on 08/03/2015. He reports experiencing thoughts of things are not going to get any better, isolation, closing self off, becoming really sad, crying a lot, anger, anxiety and panic attacks. Patient also reports having images of son's death daily.   Patient last's visit was vias telemedicine 2 weeks ago. He reports increased sadness triggered by [place] Sellers and mother sharing information from  deceased son's girlfriend's instagram page. This triggered increased thoughts about son as well as feelings of guilt, remorse, and regret. He states knowing he isn't responsible for son's death but feels like he doesn't deserve to be happy or enjoy life.  He has maintained contact with biological family but has had less contact with supportive friend. However he plans to contact friend today and try to arrange a visit while adhering to social distancing requirements. Patient reports he continues to walk daily.    Suicidal/Homicidal: Nowithout intent/plan  Therapist Response reviewed symptoms, praised and reinforced patient's efforts to engage regularly in helpful activities, facilitated patient identifying and verbalizing feelings, validated feelings,assisted patient to identify/challenge/and replace unhelpful thoughts with helpful alternative to dispel inappropriate guilt, reviewed grounding techniques,  assigned patient to select 3 objects that remind him of his son and have those available for next session,   Plan: Return again in 2 weeks.  Diagnosis: Axis I: MDD, recurrent, moderate    Axis II: No diagnosis    Adah Salvage, LCSW 12/21/2018

## 2019-01-04 ENCOUNTER — Ambulatory Visit (INDEPENDENT_AMBULATORY_CARE_PROVIDER_SITE_OTHER): Payer: Medicare Other | Admitting: Psychiatry

## 2019-01-04 ENCOUNTER — Other Ambulatory Visit: Payer: Self-pay

## 2019-01-04 DIAGNOSIS — F331 Major depressive disorder, recurrent, moderate: Secondary | ICD-10-CM

## 2019-01-04 NOTE — Progress Notes (Signed)
  Adah Salvage, LCSW  Virtual Visit via Telephone Note  I connected with [name] on 01/04/19 at 11:00 AM EDT by telephone and verified that I am speaking with the correct person using two identifiers.   I discussed the limitations, risks, security and privacy concerns of performing an evaluation and management service by telephone and the availability of in person appointments. I also discussed with the patient that there may be a patient responsible charge related to this service. The patient expressed understanding and agreed to proceed.  I provided 50 minutes of non-face-to-face time during this encounter.   Adah Salvage, LCSW   THERAPIST PROGRESS NOTE  Session Time:   Friday 01/04/2019  11:00 AM -  11:5 AM Participation Level: Active  Behavioral Response: CasualAlert/less depressed  Type of Therapy: Individual Therapy  Treatment Goals addressed: Resolve feelings of guilt and regret related to loss, accept loss, reengage in activities   Interventions: Supportive, grief therapy  Summary: Dakota Snow is a 53 y.o. male who is referred for services by psychiatrist Dr. Vanetta Shawl due to experiencing symptoms of depression. Patient reports 1 psychiatric hos4pitalizatioin due to depression and suicidal thoughts. Patient reports attending grief counseling in [place] with his wife prior to hospitalization.  Patient reports beginning to suffer with depression while a teenager. He says he began experiencing anxiety when his 35 yo son died by suicide on July 22, 2015. He reports experiencing thoughts of things are not going to get any better, isolation, closing self off, becoming really sad, crying a lot, anger, anxiety and panic attacks. Patient also reports having images of son's death daily.            Patient last's visit was via telemedicine 2 weeks ago. He reports continued stress related to impact of pandemic and being less motivated but continuing to push self to remain  engaged in activities.   He states now knowing that he doesn't deserve to be happy isn't true but still struggles with allowing self to be happy or enjoy life. He continues to express guilt and verbalizes anger with son about his death. He continues to have thoughts of "what if" regarding son's death.   Suicidal/Homicidal: Nowithout intent/plan  Therapist Response reviewed symptoms, praised and reinforced patient's efforts to engage regularly in helpful activities, facilitated patient identifying and verbalizing feelings, validated feelings,assisted patient to identify/challenge/and replace unhelpful thoughts with helpful alternative to dispel inappropriate guilt, reviewed grounding techniques,   Plan: Return again in 2 weeks.  Diagnosis: Axis I: MDD, recurrent, moderate    Axis II: No diagnosis    Adah Salvage, LCSW 01/04/2019

## 2019-01-29 ENCOUNTER — Other Ambulatory Visit (HOSPITAL_COMMUNITY): Payer: Self-pay | Admitting: Psychiatry

## 2019-01-29 MED ORDER — DIAZEPAM 5 MG PO TABS: ORAL_TABLET | ORAL | 0 refills | Status: DC

## 2019-02-01 ENCOUNTER — Ambulatory Visit (INDEPENDENT_AMBULATORY_CARE_PROVIDER_SITE_OTHER): Payer: Medicare Other | Admitting: Psychiatry

## 2019-02-01 ENCOUNTER — Other Ambulatory Visit: Payer: Self-pay

## 2019-02-01 DIAGNOSIS — F4310 Post-traumatic stress disorder, unspecified: Secondary | ICD-10-CM

## 2019-02-01 DIAGNOSIS — F331 Major depressive disorder, recurrent, moderate: Secondary | ICD-10-CM | POA: Diagnosis not present

## 2019-02-01 NOTE — Progress Notes (Signed)
Virtual Visit via Video Note  I connected with Dakota Snow on 02/01/19 at 9:10 AM  by a video enabled telemedicine application and verified that I am speaking with the correct person using two identifiers.   I discussed the limitations of evaluation and management by telemedicine and the availability of in person appointments. The patient expressed understanding and agreed to proceed.   I provided 45 minutes of non-face-to-face time during this encounter.   Adah Salvage, LCSW    THERAPIST PROGRESS NOTE  Session Time:   Friday 02/01/19 9:10  AM - 9:55 AM    Participation Level: Active  Behavioral Response: CasualAlert/less depressed  Type of Therapy: Individual Therapy  Treatment Goals addressed: Resolve feelings of guilt and regret related to loss, accept loss, reengage in activities   Interventions: Supportive, grief therapy  Summary: Dakota Snow is a 53 y.o. male who is referred for services by psychiatrist Dr. Vanetta Shawl due to experiencing symptoms of depression. Patient reports 1 psychiatric hospitalizatioin due to depression and suicidal thoughts. Patient reports attending grief counseling in [place] with his wife prior to hospitalization.  Patient reports beginning to suffer with depression while a teenager. He says he began experiencing anxiety when his 42 yo son died by suicide on 07-19-2015. He reports experiencing thoughts of things are not going to get any better, isolation, closing self off, becoming really sad, crying a lot, anger, anxiety and panic attacks. Patient also reports having images of son's death daily.            Patient last's visit was via telemedicine 4 weeks ago. He reports decreased energy as he had a virus earlier this month. He has taken antibiotics and is starting to try resume involvement in physical activity such as walking. He also attended birthday day party for wife's niece which he normally wouldn't have done. He reports it was  difficult to be there but he made himself stay. He continues to have lunch with his friend,  He reports May 8 was son's birthday. He says he tried to cope by drinking alcohol most of the day. Some of son's friends visited him that day and shared memories of son. Patient states this lifted him up but he also began to feel panicky.  He reports increased anxiety regarding daughter as she cut self. Per his report, she has engaged in SIB in the past. He reports they were able to talk about it and says she was overwhelmed with coping with her brother's birthday and other issues in her life.  Patient reports decreased irritability and anger but still struggles with feelings of guilt and regret regarding son's death. He also continues to have flashbacks.   Suicidal/Homicidal: Nowithout intent/plan  Therapist Response reviewed symptoms, praised and reinforced patient's efforts to engage in activities, facilitated patient identifying and verbalizing feelings, validated feelings, discussed ways patient uses avoidance to try to cope with son's death and effects on grief process, began to discuss self-compassion to assist patient cope with negative feelings about self, began to discuss traumatic grief and next steps for treatment to include addressing trauma issues related to patient  finding son after his death, assigned patient to continue practicing grounding techniques,   Plan: Return again in 2 weeks.  Diagnosis: Axis I: MDD, recurrent, moderate    Axis II: No diagnosis   I

## 2019-02-04 NOTE — Progress Notes (Signed)
Virtual Visit via Video Note  I connected with Dakota Snow on 02/12/19 at  9:30 AM EDT by a video enabled telemedicine application and verified that I am speaking with the correct person using two identifiers.   I discussed the limitations of evaluation and management by telemedicine and the availability of in person appointments. The patient expressed understanding and agreed to proceed.   I discussed the assessment and treatment plan with the patient. The patient was provided an opportunity to ask questions and all were answered. The patient agreed with the plan and demonstrated an understanding of the instructions.   The patient was advised to call back or seek an in-person evaluation if the symptoms worsen or if the condition fails to improve as anticipated.  I provided 25 minutes of non-face-to-face time during this encounter.   Dakota Hotter, MD     [facility] MD/PA/NP OP Progress Note  02/12/2019 10:06 AM Dakota Snow  MRN:  [id number]  Chief Complaint:  Chief Complaint    Follow-up; Depression     HPI:  This is a follow-up appointment for depression.  He states that he had "not the best" month in May, referring to his son's birthday. He believes that he and his family is "recovering" from that day. He ended up being by himself at home, and his friend visited him for support. He states that he found out a few days before the birthday that his wife would be at the beach with her sister. He "don't blame her" for it and he was actually looking forward to having "alone time." He was unsure where his daughter was. He found out that his daughter did cutting. She attributed it to her brother's birthday, and also her boyfriend who will leave for [employer].  He has good relationship with his daughter, she has "contentious relationship" with his wife.  He states that although he feels down at times, he tries not to dwell on the loss.  He also believes that he is not isolating as much as he used to.   Although he still does not feel the bed and still has struggles, he believes there has been marked improvement compared to 2 years ago.  He has been trying to do something positive, although he may not be able to do so on some day.  He has fair energy and motivation.  He sleeps better.  He has fair concentration.  He denies SI.  He feels anxious and tense at times.  Denies panic attacks. He took valium every day in May; he has not taken it for the past few days.    Visit Diagnosis:    ICD-10-CM   1. MDD (major depressive disorder), recurrent episode, mild (HCC) F33.0     Past Psychiatric History: Please see initial evaluation for full details. I have reviewed the history. No updates at this time.     Past Medical History:  Past Medical History:  Diagnosis Date  . Anxiety   . Arthritis   . Depression    watched son committ suicide  . GERD (gastroesophageal reflux disease)   . Hypertension    no meds  . PONV (postoperative nausea and vomiting)    HAD SOME ISSUES WITH NAUSEA.- THINKS ITS MEDICATION RELATED, NOT ANESTHESIA    Past Surgical History:  Procedure Laterality Date  . APPLICATION OF ROBOTIC ASSISTANCE FOR SPINAL PROCEDURE N/A 02/19/2018   Procedure: APPLICATION OF ROBOTIC ASSISTANCE FOR SPINAL PROCEDURE;  Surgeon: Barnett Abu, MD;  Location: [facility]  OR;  Service: Neurosurgery;  Laterality: N/A;  . BACK SURGERY     X 2   . HAND SURGERY    . NECK SURGERY      Family Psychiatric History: Please see initial evaluation for full details. I have reviewed the history. No updates at this time.     Family History:  Family History  Problem Relation Age of Onset  . Anxiety disorder Mother   . Depression Father     Social History:  Social History   Socioeconomic History  . Marital status: Married    Spouse name: Not on file  . Number of children: Not on file  . Years of education: Not on file  . Highest education level: Not on file  Occupational History  . Not on file   Social Needs  . Financial resource strain: Not on file  . Food insecurity:    Worry: Not on file    Inability: Not on file  . Transportation needs:    Medical: Not on file    Non-medical: Not on file  Tobacco Use  . Smoking status: Former Smoker    Years: 1.00    Last attempt to quit: 07/09/1988    Years since quitting: 30.6  . Smokeless tobacco: Former NeurosurgeonUser    Types: Chew    Quit date: 11/20/2017  Substance and Sexual Activity  . Alcohol use: Not Currently    Comment: occ  . Drug use: Not on file    Comment: CBD OINTMENT ON BACK  . Sexual activity: Yes    Birth control/protection: Implant  Lifestyle  . Physical activity:    Days per week: Not on file    Minutes per session: Not on file  . Stress: Not on file  Relationships  . Social connections:    Talks on phone: Not on file    Gets together: Not on file    Attends religious service: Not on file    Active member of club or organization: Not on file    Attends meetings of clubs or organizations: Not on file    Relationship status: Not on file  Other Topics Concern  . Not on file  Social History Narrative  . Not on file    Allergies: No Known Allergies  Metabolic Disorder Labs: No results found for: HGBA1C, MPG No results found for: PROLACTIN No results found for: CHOL, TRIG, HDL, CHOLHDL, VLDL, LDLCALC No results found for: TSH  Therapeutic Level Labs: No results found for: LITHIUM No results found for: VALPROATE No components found for:  CBMZ  Current Medications: Current Outpatient Medications  Medication Sig Dispense Refill  . acetaminophen (TYLENOL) 500 MG tablet Take 500 mg by mouth daily as needed for moderate pain.    . Ca Carbonate-Mag Hydroxide (ROLAIDS ANTACID ULTRA STRENGTH PO) Take 1-2 tablets by mouth at bedtime as needed (indigestion/heartburn).     . diazepam (VALIUM) 5 MG tablet 2.5-5 mg daily as needed for anxiety 30 tablet 0  . DULoxetine (CYMBALTA) 60 MG capsule Take 1 capsule (60 mg  total) by mouth daily. 90 capsule 0  . fluticasone (FLONASE) 50 MCG/ACT nasal spray Place 1 spray into both nostrils daily.    Marland Kitchen. lisinopril-hydrochlorothiazide (PRINZIDE,ZESTORETIC) 10-12.5 MG tablet Take 1 tablet by mouth every evening.     No current facility-administered medications for this visit.      Musculoskeletal: Strength & Muscle Tone: N/A Gait & Station: N/A Patient leans: N/A  Psychiatric Specialty Exam: Review of Systems  Psychiatric/Behavioral: Positive for depression. Negative for hallucinations, memory loss, substance abuse and suicidal ideas. The patient is nervous/anxious. The patient does not have insomnia.   All other systems reviewed and are negative.   There were no vitals taken for this visit.There is no height or weight on file to calculate BMI.  General Appearance: Fairly Groomed  Eye Contact:  Good  Speech:  Clear and Coherent  Volume:  Normal  Mood:  Depressed  Affect:  Appropriate, Congruent and euthymic, but sad at times  Thought Process:  Coherent  Orientation:  Full (Time, Place, and Person)  Thought Content: Logical   Suicidal Thoughts:  No  Homicidal Thoughts:  No  Memory:  Immediate;   Good  Judgement:  Good  Insight:  Fair  Psychomotor Activity:  Normal  Concentration:  Concentration: Good and Attention Span: Good  Recall:  Good  Fund of Knowledge: Good  Language: Good  Akathisia:  No  Handed:  Right  AIMS (if indicated): not done  Assets:  Communication Skills Desire for Improvement  ADL's:  Intact  Cognition: WNL  Sleep:  Fair   Screenings: PHQ2-9     Counselor from 09/04/2018 in [facility]  PHQ-2 Total Score  4  PHQ-9 Total Score  21       Assessment and Plan:  Dakota Snow is a 53 y.o. year old male with a history of depression, PTSD, spondylolisthesis L4-L5, lumbar radiculopathy , who presents for follow up appointment for MDD (major depressive disorder), recurrent episode,  mild (HCC)  # MDD, mild, recurrent without psychotic features # PTSD Although he continues to report occasional depressive symptoms especially in the context of birthday of his son, who deceased from suicide on 2016/12/04he has been handling things fairly well since the last visit.  Will continue duloxetine to target depression and PTSD.  Will continue Valium as needed for anxiety.  Discussed risk of dependence and oversedation.  Coached importance of commitment to value congruent action he can takes while validating grief.   Plan 1.Continueduloxetine  daily  2. Continue valium2.5 mg - 5 mg daily as needed for anxiety(he declines refill) 3. Next appointment: 8/4 at 9 AM for 30 mins, video  Past trials of medication: duloxetine, valium (may have tried sertraline in the past, which made him feel weird)  The patient demonstrates the following risk factors for suicide: Chronic risk factors for suicide include:psychiatric disorder ofdepressionand completed suicide in a family member. Acute risk factorsfor suicide include: unemployment, loss (financial, interpersonal, professional) and recent discharge from inpatient psychiatry. Protective factorsfor this patient include: positive social support, responsibility to others (children, family), coping skills and hope for the future. Considering these factors, the overall suicide risk at this point appears to below. Patientisappropriate for outpatient follow up. Guns were removed by his wife.  The duration of this appointment visit was 25 minutes of non face-to-face time with the patient.  Greater than 50% of this time was spent in counseling, explanation of  diagnosis, planning of further management, and coordination of care.  Dakota Hotter, MD 02/12/2019, 10:06 AM

## 2019-02-12 ENCOUNTER — Ambulatory Visit (INDEPENDENT_AMBULATORY_CARE_PROVIDER_SITE_OTHER): Payer: Medicare Other | Admitting: Psychiatry

## 2019-02-12 ENCOUNTER — Other Ambulatory Visit: Payer: Self-pay

## 2019-02-12 ENCOUNTER — Encounter (HOSPITAL_COMMUNITY): Payer: Self-pay | Admitting: Psychiatry

## 2019-02-12 DIAGNOSIS — F330 Major depressive disorder, recurrent, mild: Secondary | ICD-10-CM | POA: Diagnosis not present

## 2019-02-12 MED ORDER — DULOXETINE HCL 60 MG PO CPEP: 60.0000 mg | ORAL_CAPSULE | Freq: Every day | ORAL | 0 refills | Status: DC

## 2019-02-12 NOTE — Patient Instructions (Signed)
1.Continueduloxetine 60mg  daily  2. Continue valium2.5 mg - 5 mg daily as needed for anxiety 3. Next appointment: 8/4 at 9 AM

## 2019-02-22 ENCOUNTER — Other Ambulatory Visit: Payer: Self-pay

## 2019-02-22 ENCOUNTER — Ambulatory Visit (INDEPENDENT_AMBULATORY_CARE_PROVIDER_SITE_OTHER): Payer: Medicare Other | Admitting: Psychiatry

## 2019-02-22 ENCOUNTER — Encounter (HOSPITAL_COMMUNITY): Payer: Self-pay | Admitting: Psychiatry

## 2019-02-22 DIAGNOSIS — F330 Major depressive disorder, recurrent, mild: Secondary | ICD-10-CM

## 2019-02-22 DIAGNOSIS — F4310 Post-traumatic stress disorder, unspecified: Secondary | ICD-10-CM | POA: Diagnosis not present

## 2019-02-22 NOTE — Progress Notes (Signed)
Virtual Visit via Video Note  I connected with Dakota Snow on 02/22/19 at 10:00 AM EDT by a video enabled telemedicine application and verified that I am speaking with the correct person using two identifiers.   I discussed the limitations of evaluation and management by telemedicine and the availability of in person appointments. The patient expressed understanding and agreed to proceed.  I provided 50 minutes of non-face-to-face time during this encounter.   Dakota Smoker, LCSW      THERAPIST PROGRESS NOTE  Session Time:   Friday 02/22/2019 10:00 AM - 10:50 AM       Participation Level: Active  Behavioral Response: CasualAlert/less depressed  Type of Therapy: Individual Therapy  Treatment Goals addressed: Resolve feelings of guilt and regret related to loss, accept loss, reengage in activities   Interventions: Supportive, grief therapy  Summary: Dakota Snow is a 53 y.o. male who is referred for services by psychiatrist Dr. Modesta Messing due to experiencing symptoms of depression. Patient reports 1 psychiatric hospitalizatioin due to depression and suicidal thoughts. Patient reports attending grief counseling in [place] with his wife prior to hospitalization.  Patient reports beginning to suffer with depression while a teenager. He says he began experiencing anxiety when his 77 yo son died by suicide on Aug 08, 2015. He reports experiencing thoughts of things are not going to get any better, isolation, closing self off, becoming really sad, crying a lot, anger, anxiety and panic attacks. Patient also reports having images of son's death daily.            Patient last's visit was via telemedicine 3 weeks ago. He reports continued anxiety  and says he has flashbacks about once per day. He  visited family for a week and attended his nephew's wedding in [place]. He reports having mixed feelings and experiencing anxiety especially at mother's home as she has pictures of his son and  cried at times when she talked to him about son. He says he did become angry and overreact in another situation while there but was able to verbalize his his feelings rather than suppress. He reports he experienced pleasure and laughed with his family members without feeling guilty. He continues to maintain positive self-care regarding exercise and  continues to have support from his friend.   Suicidal/Homicidal: Nowithout intent/plan  Therapist Response reviewed symptoms, praised and reinforced patient's efforts to engage in activities, facilitated patient identifying and verbalizing thoughts and  feelings about impact of visit with family, validated feelings, began to discuss using CPT to treat traumatic grief, discussed rationale for and assisted patient practice using meditation with focused breathing to cope with anxiety, assigned patient to practice 5-10 minutes daily   Plan: Return again in 2 weeks.  Diagnosis: Axis I: MDD, recurrent, moderate    Axis II: No diagnosis   I

## 2019-03-06 ENCOUNTER — Telehealth (HOSPITAL_COMMUNITY): Payer: Self-pay | Admitting: Psychiatry

## 2019-03-06 ENCOUNTER — Ambulatory Visit (HOSPITAL_COMMUNITY): Payer: Medicare Other | Admitting: Psychiatry

## 2019-03-06 ENCOUNTER — Other Ambulatory Visit: Payer: Self-pay

## 2019-03-06 NOTE — Telephone Encounter (Signed)
Therapist sent patient text message twice for scheduled appointment, no response

## 2019-04-01 NOTE — Progress Notes (Signed)
Virtual Visit via Video Note  I connected with Dakota Snow on 04/09/19 at  9:00 AM EDT by a video enabled telemedicine application and verified that I am speaking with the correct person using two identifiers.   I discussed the limitations of evaluation and management by telemedicine and the availability of in person appointments. The patient expressed understanding and agreed to proceed.     I discussed the assessment and treatment plan with the patient. The patient was provided an opportunity to ask questions and all were answered. The patient agreed with the plan and demonstrated an understanding of the instructions.   The patient was advised to call back or seek an in-person evaluation if the symptoms worsen or if the condition fails to improve as anticipated.  I provided 25 minutes of non-face-to-face time during this encounter.   Norman Clay, MD    [facility] MD/PA/NP OP Progress Note  04/09/2019 9:44 AM Dakota Snow  MRN:  [id number]  Chief Complaint:  Chief Complaint    Follow-up; Depression     HPI:  This is a follow-up appointment for depression and PTSD.  He states that he has been doing better. He feels "even" and his anxiety is "not prevalent" as it used to be.  Although he still misses his son, which makes him feel like he is on "down hill," he has been able to "do something productive" and not fixating on these thoughts. He states that "it's gonna be that way, and it is okay."  He states that he had a birthday yesterday.  Although it used to be a "hard day," but it was refreshing for him. He had lunch with his friend, who also lost one's son. He reports good bond with him. His wife cooked lasagna and his daughter came home after work instead of going out with her friends.  He also talks about his mother and brothers, who contacted with the patient on his birthday. He states that his wife's mood has been "climbing up" and reports better communication with her. He also  thinks that his daughter has been talking with him more, and she has been busy after starting a job.  He sleeps 5 to 7 hours.  He feels less depressed.  He has fair concentration.  He has good energy and motivation.  Although he has fleeting SI, it has become less intense and he denies any plan or intent.  He feels anxious and tense at times.  He denies panic attacks.  He rarely takes Valium.  He denies nightmares.  He had flashback twice last month, although it has become less intense.  He denies hypervigilance.   Visit Diagnosis:    ICD-10-CM   1. MDD (major depressive disorder), recurrent episode, mild ([place])  F33.0   2. PTSD (post-traumatic stress disorder)  F43.10     Past Psychiatric History: Please see initial evaluation for full details. I have reviewed the history. No updates at this time.     Past Medical History:  Past Medical History:  Diagnosis Date  . Anxiety   . Arthritis   . Depression    watched son committ suicide  . GERD (gastroesophageal reflux disease)   . Hypertension    no meds  . PONV (postoperative nausea and vomiting)    HAD SOME ISSUES WITH NAUSEA.- THINKS ITS MEDICATION RELATED, NOT ANESTHESIA    Past Surgical History:  Procedure Laterality Date  . APPLICATION OF ROBOTIC ASSISTANCE FOR SPINAL PROCEDURE N/A 02/19/2018   Procedure:  APPLICATION OF ROBOTIC ASSISTANCE FOR SPINAL PROCEDURE;  Surgeon: Barnett AbuElsner, Henry, MD;  Location: [facility] OR;  Service: Neurosurgery;  Laterality: N/A;  . BACK SURGERY     X 2   . HAND SURGERY    . NECK SURGERY      Family Psychiatric History: Please see initial evaluation for full details. I have reviewed the history. No updates at this time.     Family History:  Family History  Problem Relation Age of Onset  . Anxiety disorder Mother   . Depression Father     Social History:  Social History   Socioeconomic History  . Marital status: Married    Spouse name: Not on file  . Number of children: Not on file  . Years of  education: Not on file  . Highest education level: Not on file  Occupational History  . Not on file  Social Needs  . Financial resource strain: Not on file  . Food insecurity    Worry: Not on file    Inability: Not on file  . Transportation needs    Medical: Not on file    Non-medical: Not on file  Tobacco Use  . Smoking status: Former Smoker    Years: 1.00    Quit date: 07/09/1988    Years since quitting: 30.7  . Smokeless tobacco: Former NeurosurgeonUser    Types: Chew    Quit date: 11/20/2017  Substance and Sexual Activity  . Alcohol use: Not Currently    Comment: occ  . Drug use: Not on file    Comment: CBD OINTMENT ON BACK  . Sexual activity: Yes    Birth control/protection: Implant  Lifestyle  . Physical activity    Days per week: Not on file    Minutes per session: Not on file  . Stress: Not on file  Relationships  . Social Musicianconnections    Talks on phone: Not on file    Gets together: Not on file    Attends religious service: Not on file    Active member of club or organization: Not on file    Attends meetings of clubs or organizations: Not on file    Relationship status: Not on file  Other Topics Concern  . Not on file  Social History Narrative  . Not on file    Allergies: No Known Allergies  Metabolic Disorder Labs: No results found for: HGBA1C, MPG No results found for: PROLACTIN No results found for: CHOL, TRIG, HDL, CHOLHDL, VLDL, LDLCALC No results found for: TSH  Therapeutic Level Labs: No results found for: LITHIUM No results found for: VALPROATE No components found for:  CBMZ  Current Medications: Current Outpatient Medications  Medication Sig Dispense Refill  . acetaminophen (TYLENOL) 500 MG tablet Take 500 mg by mouth daily as needed for moderate pain.    . Ca Carbonate-Mag Hydroxide (ROLAIDS ANTACID ULTRA STRENGTH PO) Take 1-2 tablets by mouth at bedtime as needed (indigestion/heartburn).     . diazepam (VALIUM) 5 MG tablet 2.5-5 mg daily as needed  for anxiety 30 tablet 0  . [START ON 05/13/2019] DULoxetine (CYMBALTA) 60 MG capsule Take 1 capsule (60 mg total) by mouth daily. 90 capsule 0  . fluticasone (FLONASE) 50 MCG/ACT nasal spray Place 1 spray into both nostrils daily.    Marland Kitchen. lisinopril-hydrochlorothiazide (PRINZIDE,ZESTORETIC) 10-12.5 MG tablet Take 1 tablet by mouth every evening.     No current facility-administered medications for this visit.      Musculoskeletal: Strength & Muscle Tone:  N/A Gait & Station: N/A Patient leans: N/A  Psychiatric Specialty Exam: Review of Systems  Psychiatric/Behavioral: Positive for depression. Negative for hallucinations, memory loss, substance abuse and suicidal ideas. The patient is nervous/anxious. The patient does not have insomnia.   All other systems reviewed and are negative.   There were no vitals taken for this visit.There is no height or weight on file to calculate BMI.  General Appearance: Fairly Groomed  Eye Contact:  Good  Speech:  Clear and Coherent  Volume:  Normal  Mood:  "better"  Affect:  Appropriate, Congruent and calmer  Thought Process:  Coherent  Orientation:  Full (Time, Place, and Person)  Thought Content: Logical   Suicidal Thoughts:  No  Homicidal Thoughts:  No  Memory:  Immediate;   Good  Judgement:  Good  Insight:  Good  Psychomotor Activity:  Normal  Concentration:  Concentration: Good and Attention Span: Good  Recall:  Good  Fund of Knowledge: Good  Language: Good  Akathisia:  No  Handed:  Right  AIMS (if indicated): not done  Assets:  Communication Skills Desire for Improvement  ADL's:  Intact  Cognition: WNL  Sleep:  Good   Screenings: PHQ2-9     Counselor from 09/04/2018 in [facility]  PHQ-2 Total Score  4  PHQ-9 Total Score  21       Assessment and Plan:  Dakota Snow is a 53 y.o. year old male with a history of depression, PTSD,spondylolisthesis L4-L5, lumbar radiculopathy, who  presents for follow up appointment for depression.    # MDD, mild, recurrent without psychotic features # PTSD There has been steady improvement in depressive symptoms and anxiety since the last visit.  Psychosocial stressors includes his son suicide in November 2016.  Will continue duloxetine to target PTSD and depression.  Will continue Valium as needed for anxiety.  Discussed risk of dependence and oversedation. Validated his grief, and coached cognitive defusion.   Plan  I have reviewed and updated plans as below 1.Continueduloxetine 60mg  daily  2. Continue valium2.5 mg - 5 mg daily as needed for anxiety(he declines refill) 3. Next appointment: 10/30 at 8:40 for 20 mins, video  Past trials of medication:duloxetine, valium (may have tried sertraline in the past, which made him feel weird)  The patient demonstrates the following risk factors for suicide: Chronic risk factors for suicide include:psychiatric disorder ofdepressionand completed suicide in a family member. Acute risk factorsfor suicide include: unemployment, loss (financial, interpersonal, professional) and recent discharge from inpatient psychiatry. Protective factorsfor this patient include: positive social support, responsibility to others (children, family), coping skills and hope for the future. Considering these factors, the overall suicide risk at this point appears to below. Patientisappropriate for outpatient follow up. Guns were removed by his wife.  The duration of this appointment visit was 25 minutes of not face-to-face time with the patient.  Greater than 50% of this time was spent in counseling, explanation of  diagnosis, planning of further management, and coordination of care.  Neysa Hottereina Tywaun Hiltner, MD 04/09/2019, 9:44 AM

## 2019-04-09 ENCOUNTER — Encounter (HOSPITAL_COMMUNITY): Payer: Self-pay | Admitting: Psychiatry

## 2019-04-09 ENCOUNTER — Other Ambulatory Visit: Payer: Self-pay

## 2019-04-09 ENCOUNTER — Ambulatory Visit (INDEPENDENT_AMBULATORY_CARE_PROVIDER_SITE_OTHER): Payer: Medicare Other | Admitting: Psychiatry

## 2019-04-09 DIAGNOSIS — F4310 Post-traumatic stress disorder, unspecified: Secondary | ICD-10-CM

## 2019-04-09 DIAGNOSIS — F330 Major depressive disorder, recurrent, mild: Secondary | ICD-10-CM | POA: Diagnosis not present

## 2019-04-09 MED ORDER — DULOXETINE HCL 60 MG PO CPEP: 60.0000 mg | ORAL_CAPSULE | Freq: Every day | ORAL | 0 refills | Status: DC

## 2019-04-09 NOTE — Patient Instructions (Signed)
1.Continueduloxetine 60mg  daily  2. Continue valium2.5 mg - 5 mg daily as needed for anxiety 3. Next appointment: 10/30 at 8:40

## 2019-06-27 NOTE — Progress Notes (Signed)
Virtual Visit via Video Note  I connected with Dakota Snow on 07/05/19 at  8:40 AM EDT by a video enabled telemedicine application and verified that I am speaking with the correct person using two identifiers.   I discussed the limitations of evaluation and management by telemedicine and the availability of in person appointments. The patient expressed understanding and agreed to proceed.     I discussed the assessment and treatment plan with the patient. The patient was provided an opportunity to ask questions and all were answered. The patient agreed with the plan and demonstrated an understanding of the instructions.   The patient was advised to call back or seek an in-person evaluation if the symptoms worsen or if the condition fails to improve as anticipated.  I provided 15 minutes of non-face-to-face time during this encounter.   Neysa Hottereina Cortni Tays, MD    [facility] MD/PA/NP OP Progress Note  07/05/2019 8:59 AM Dakota Snow  MRN:  [id number]  Chief Complaint:  Chief Complaint    Depression; Follow-up     HPI:  This is a follow-up appointment for depression.  He states that he has been handling things well.  He has been trying to stay busy, and do something. He appreciates for what it is, rather than trying to grade it. His wife is struggling at work as she needs to teach virtually. His daughter is opening up more, and shares with him that how sad she is about losing her brother. He occasionally has moments of feeling depressed and has passive SI. He believes that these feeling are not consuming anymore, and not feels "stuck." He thinks he has been making progress. He has insomnia, which he mainly attributes to pain.  He has occasional anhedonia and low energy, although it has been improving.  He has fair concentration.  He feels anxious and tense at times.  He had a few panic attacks when he was in grocery store.  He has not taken Valium at least since the last visit.    Visit  Diagnosis:    ICD-10-CM   1. MDD (major depressive disorder), recurrent episode, mild (HCC)  F33.0   2. PTSD (post-traumatic stress disorder)  F43.10     Past Psychiatric History: Please see initial evaluation for full details. I have reviewed the history. No updates at this time.     Past Medical History:  Past Medical History:  Diagnosis Date  . Anxiety   . Arthritis   . Depression    watched son committ suicide  . GERD (gastroesophageal reflux disease)   . Hypertension    no meds  . PONV (postoperative nausea and vomiting)    HAD SOME ISSUES WITH NAUSEA.- THINKS ITS MEDICATION RELATED, NOT ANESTHESIA    Past Surgical History:  Procedure Laterality Date  . APPLICATION OF ROBOTIC ASSISTANCE FOR SPINAL PROCEDURE N/A 02/19/2018   Procedure: APPLICATION OF ROBOTIC ASSISTANCE FOR SPINAL PROCEDURE;  Surgeon: Barnett AbuElsner, Henry, MD;  Location: [facility] OR;  Service: Neurosurgery;  Laterality: N/A;  . BACK SURGERY     X 2   . HAND SURGERY    . NECK SURGERY      Family Psychiatric History: Please see initial evaluation for full details. I have reviewed the history. No updates at this time.     Family History:  Family History  Problem Relation Age of Onset  . Anxiety disorder Mother   . Depression Father     Social History:  Social History   Socioeconomic History  .  Marital status: Married    Spouse name: Not on file  . Number of children: Not on file  . Years of education: Not on file  . Highest education level: Not on file  Occupational History  . Not on file  Social Needs  . Financial resource strain: Not on file  . Food insecurity    Worry: Not on file    Inability: Not on file  . Transportation needs    Medical: Not on file    Non-medical: Not on file  Tobacco Use  . Smoking status: Former Smoker    Years: 1.00    Quit date: 07/09/1988    Years since quitting: 31.0  . Smokeless tobacco: Former Systems developer    Types: Foley date: 11/20/2017  Substance and Sexual  Activity  . Alcohol use: Not Currently    Comment: occ  . Drug use: Not on file    Comment: CBD OINTMENT ON BACK  . Sexual activity: Yes    Birth control/protection: Implant  Lifestyle  . Physical activity    Days per week: Not on file    Minutes per session: Not on file  . Stress: Not on file  Relationships  . Social Herbalist on phone: Not on file    Gets together: Not on file    Attends religious service: Not on file    Active member of club or organization: Not on file    Attends meetings of clubs or organizations: Not on file    Relationship status: Not on file  Other Topics Concern  . Not on file  Social History Narrative  . Not on file    Allergies: No Known Allergies  Metabolic Disorder Labs: No results found for: HGBA1C, MPG No results found for: PROLACTIN No results found for: CHOL, TRIG, HDL, CHOLHDL, VLDL, LDLCALC No results found for: TSH  Therapeutic Level Labs: No results found for: LITHIUM No results found for: VALPROATE No components found for:  CBMZ  Current Medications: Current Outpatient Medications  Medication Sig Dispense Refill  . acetaminophen (TYLENOL) 500 MG tablet Take 500 mg by mouth daily as needed for moderate pain.    . Ca Carbonate-Mag Hydroxide (ROLAIDS ANTACID ULTRA STRENGTH PO) Take 1-2 tablets by mouth at bedtime as needed (indigestion/heartburn).     . diazepam (VALIUM) 5 MG tablet 2.5-5 mg daily as needed for anxiety 30 tablet 0  . [START ON 08/09/2019] DULoxetine (CYMBALTA) 60 MG capsule Take 1 capsule (60 mg total) by mouth daily. 90 capsule 1  . fluticasone (FLONASE) 50 MCG/ACT nasal spray Place 1 spray into both nostrils daily.    Marland Kitchen lisinopril-hydrochlorothiazide (PRINZIDE,ZESTORETIC) 10-12.5 MG tablet Take 1 tablet by mouth every evening.     No current facility-administered medications for this visit.      Musculoskeletal: Strength & Muscle Tone: N/A Gait & Station: N/A Patient leans: N/A  Psychiatric  Specialty Exam: Review of Systems  Psychiatric/Behavioral: Positive for depression and suicidal ideas. Negative for hallucinations, memory loss and substance abuse. The patient is nervous/anxious and has insomnia.   All other systems reviewed and are negative.   There were no vitals taken for this visit.There is no height or weight on file to calculate BMI.  General Appearance: Fairly Groomed  Eye Contact:  Good  Speech:  Clear and Coherent  Volume:  Normal  Mood:  "better"  Affect:  euthymic, (wearing sunglass)  Thought Process:  Coherent  Orientation:  Full (Time, Place,  and Person)  Thought Content: Logical   Suicidal Thoughts:  Yes.  without intent/plan  Homicidal Thoughts:  No  Memory:  Immediate;   Good  Judgement:  Good  Insight:  Good  Psychomotor Activity:  Normal  Concentration:  Concentration: Good and Attention Span: Good  Recall:  Good  Fund of Knowledge: Good  Language: Good  Akathisia:  No  Handed:  Right  AIMS (if indicated): not done  Assets:  Communication Skills Desire for Improvement  ADL's:  Intact  Cognition: WNL  Sleep:  Good   Screenings: PHQ2-9     Counselor from 09/04/2018 in [facility]  PHQ-2 Total Score  4  PHQ-9 Total Score  21       Assessment and Plan:  Dakota Snow is a 53 y.o. year old male with a history of depression, PTSD,spondylolisthesis L4-L5, lumbar radiculopathy , who presents for follow up appointment for MDD (major depressive disorder), recurrent episode, mild (HCC)  PTSD (post-traumatic stress disorder)  # MDD, mild,  recurrent without psychotic features # PTSD He continues to have depressive symptoms and anxiety, PTSD symptoms in the context of upcoming anniversary of loss of his son by suicide in November 16.  Although he may benefit from up titration of duloxetine, he would like to stay on the current dose.  Will continue duloxetine to target depression and PTSD.  We will  continue Valium as needed for anxiety.  Discussed risk of dependence and oversedation.  Validated his grief.  He will continue to have follow-up with Ms. Bynum for therapy.   Plan  I have reviewed and updated plans as below 1.Continueduloxetine 60mg  daily  2. Continue valium2.5 mg - 5 mg daily as needed for anxiety(he declines refill) 3.Next appointment: in December - font desk to contact for therapy follow up  Past trials of medication:duloxetine, valium (may have tried sertraline in the past, which made him feel weird)  I have reviewed suicide assessment in detail. No change in the following assessment.   The patient demonstrates the following risk factors for suicide: Chronic risk factors for suicide include:psychiatric disorder ofdepressionand completed suicide in a family member. Acute risk factorsfor suicide include: unemployment, loss (financial, interpersonal, professional) and recent discharge from inpatient psychiatry. Protective factorsfor this patient include: positive social support, responsibility to others (children, family), coping skills and hope for the future. Considering these factors, the overall suicide risk at this point appears to below. Patientisappropriate for outpatient follow up. Guns were removed by his wife.  January, MD 07/05/2019, 8:59 AM

## 2019-07-05 ENCOUNTER — Ambulatory Visit (INDEPENDENT_AMBULATORY_CARE_PROVIDER_SITE_OTHER): Payer: Medicare Other | Admitting: Psychiatry

## 2019-07-05 ENCOUNTER — Other Ambulatory Visit: Payer: Self-pay

## 2019-07-05 ENCOUNTER — Encounter (HOSPITAL_COMMUNITY): Payer: Self-pay | Admitting: Psychiatry

## 2019-07-05 DIAGNOSIS — F4310 Post-traumatic stress disorder, unspecified: Secondary | ICD-10-CM

## 2019-07-05 DIAGNOSIS — F330 Major depressive disorder, recurrent, mild: Secondary | ICD-10-CM

## 2019-07-05 MED ORDER — DULOXETINE HCL 60 MG PO CPEP: 60.0000 mg | ORAL_CAPSULE | Freq: Every day | ORAL | 1 refills | Status: DC

## 2019-07-05 NOTE — Patient Instructions (Signed)
1.Continueduloxetine 60mg  daily  2. Continue valium2.5 mg - 5 mg daily as needed for anxiety 3.Next appointment: in December

## 2019-08-05 ENCOUNTER — Telehealth (HOSPITAL_COMMUNITY): Payer: Self-pay | Admitting: *Deleted

## 2019-08-05 ENCOUNTER — Other Ambulatory Visit (HOSPITAL_COMMUNITY): Payer: Self-pay | Admitting: Psychiatry

## 2019-08-05 MED ORDER — DIAZEPAM 5 MG PO TABS
ORAL_TABLET | ORAL | 0 refills | Status: DC
Start: 2019-08-05 — End: 2019-10-15

## 2019-08-05 NOTE — Telephone Encounter (Signed)
Already sent.

## 2019-08-05 NOTE — Telephone Encounter (Signed)
DR ROSS DR HISADA's PATIENT CALLED & A  FAXED REFILL REQUEST FOR diazepam (VALIUM) 5 MG tablet. AT LAST VISIT 07/05/2019 PER OFFICE NOTE : Continue valium2.5 mg - 5 mg daily as needed for anxiety(he declines refill)

## 2019-09-16 IMAGING — RF DG MYELOGRAM LUMBAR
12 series · 12 of 12 positions shown · non-contrast
Comparison: None

CLINICAL DATA: Low back pain.  LEFT leg pain.
TECHNIQUE: Contiguous axial images were obtained through the Lumbar spine after
the intrathecal infusion of infusion. Coronal and sagittal
reconstructions were obtained of the axial image sets.

[Series 1: cp_standard · 0.19mm/px · 1 of 1 slices shown (1 of 2)]
[im 1/1]
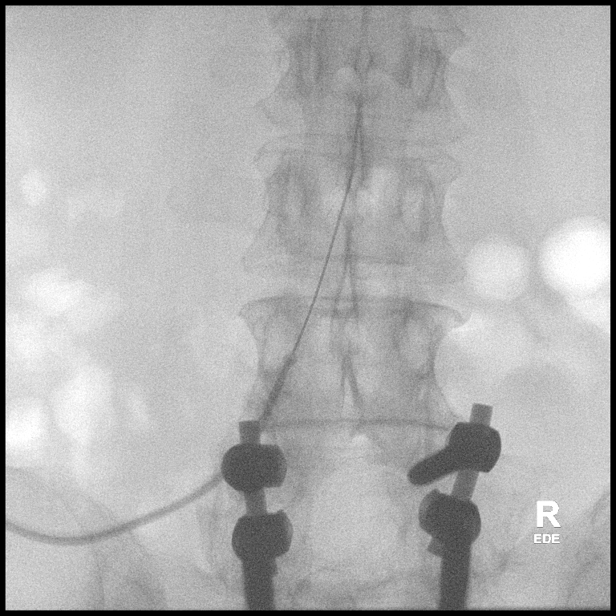

[Series 2: cp_standard · 0.18mm/px · 1 of 1 slices shown (2 of 2)]
[im 1/1]
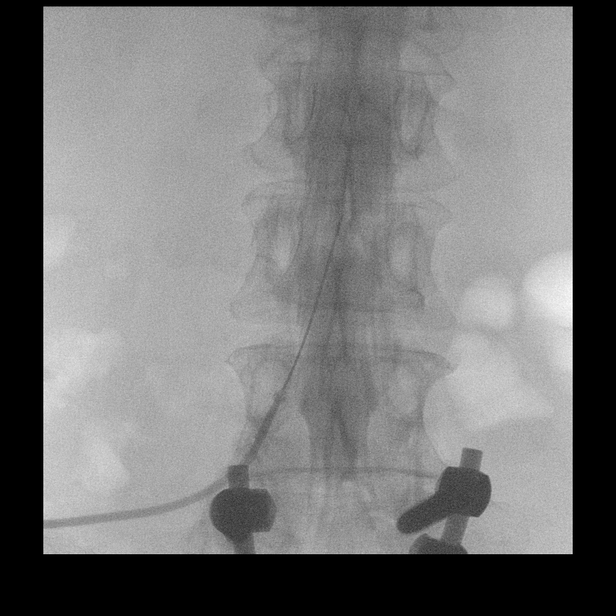

[Series 3: fluoro_myelogram_singleshot_bw · 0.20mm/px · 1 of 1 slices shown (1 of 10)]
[im 1/1]
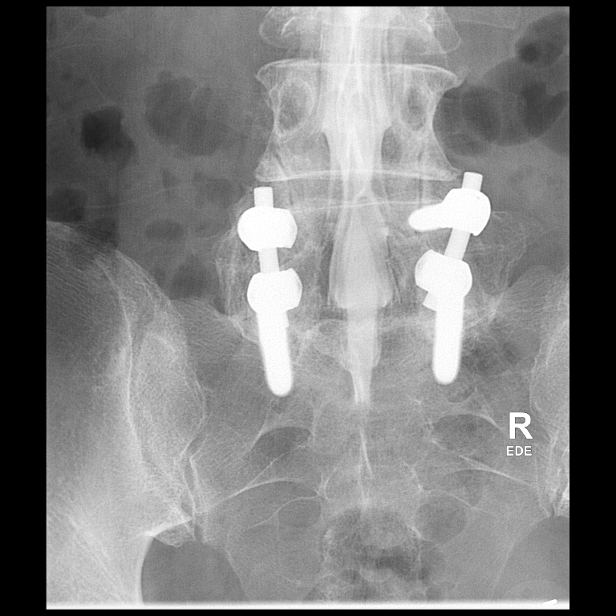

[Series 4: fluoro_myelogram_singleshot_bw · 0.20mm/px · 1 of 1 slices shown (2 of 10)]
[im 1/1]
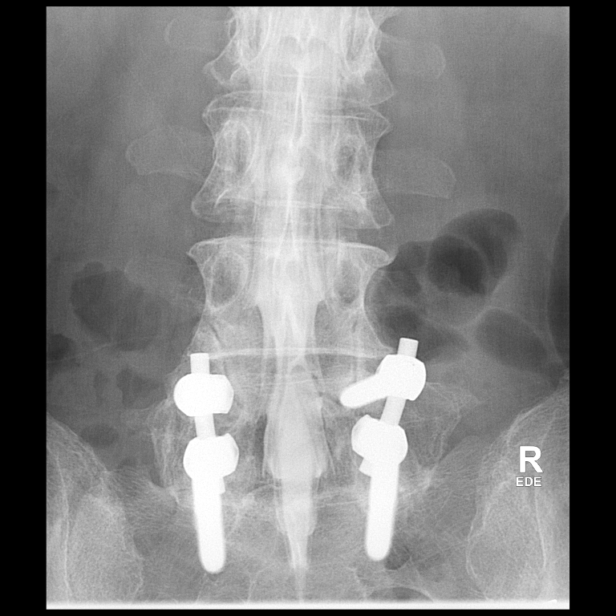

[Series 5: fluoro_myelogram_singleshot_bw · 0.21mm/px · 1 of 1 slices shown (3 of 10)]
[im 1/1]
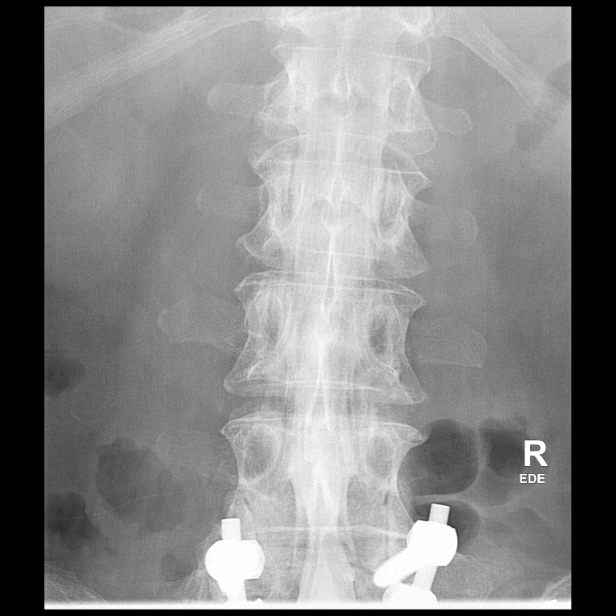

[Series 6: fluoro_myelogram_singleshot_bw · 0.21mm/px · 1 of 1 slices shown (4 of 10)]
[im 1/1]
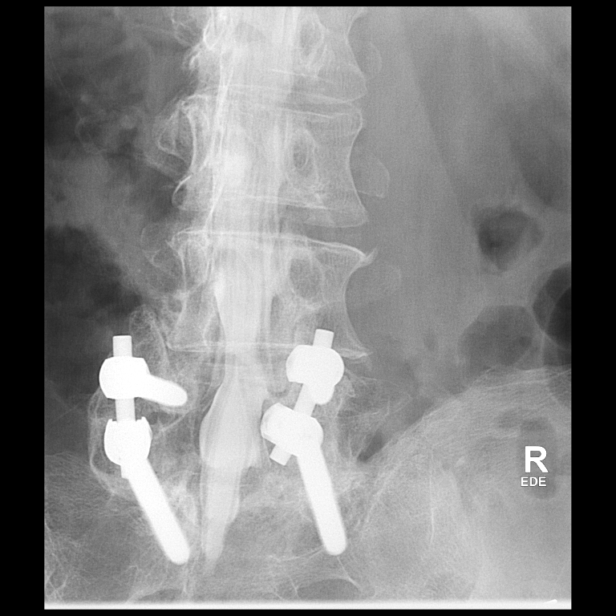

[Series 7: fluoro_myelogram_singleshot_bw · 0.21mm/px · 1 of 1 slices shown (5 of 10)]
[im 1/1]
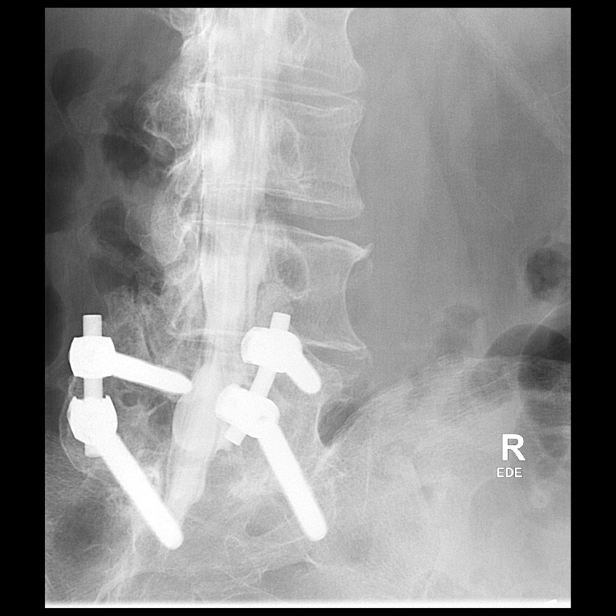

[Series 8: fluoro_myelogram_singleshot_bw · 0.21mm/px · 1 of 1 slices shown (6 of 10)]
[im 1/1]
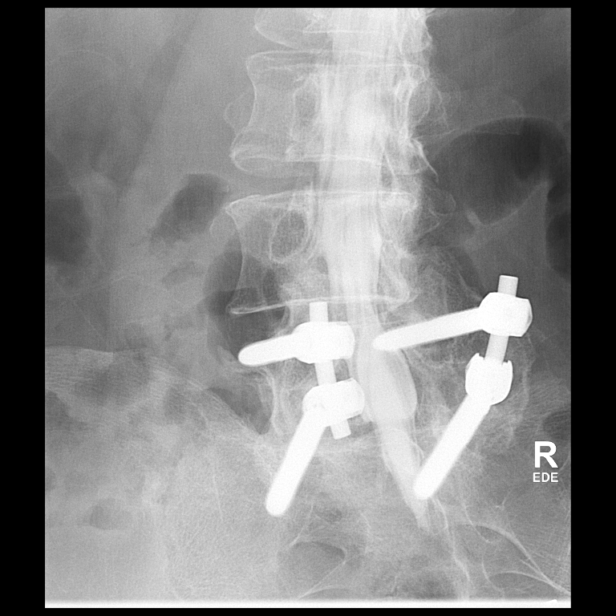

[Series 9: fluoro_myelogram_singleshot_bw · 0.21mm/px · 1 of 1 slices shown (7 of 10)]
[im 1/1]
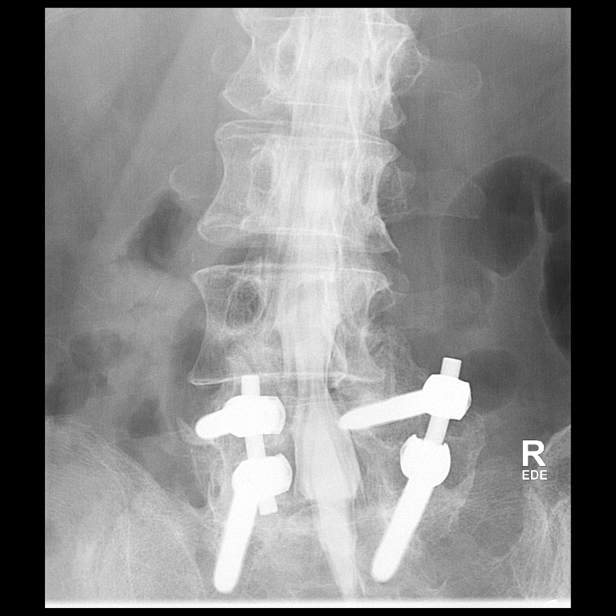

[Series 10: fluoro_myelogram_singleshot_bw · 0.20mm/px · 1 of 1 slices shown (8 of 10)]
[im 1/1]
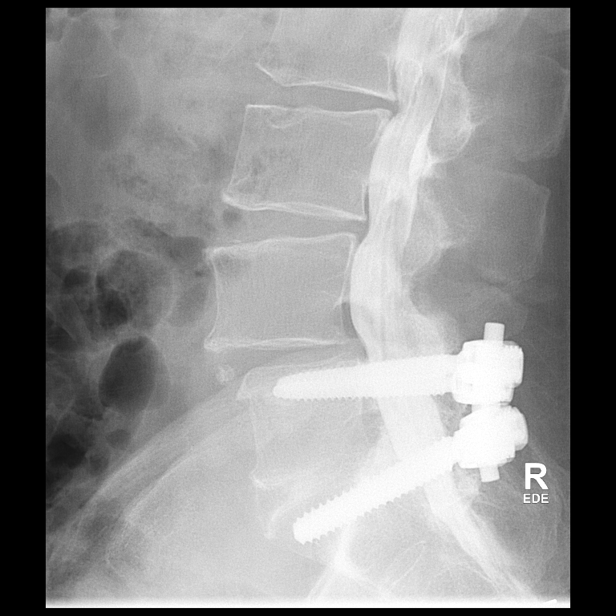

[Series 11: fluoro_myelogram_singleshot_bw · 0.20mm/px · 1 of 1 slices shown (9 of 10)]
[im 1/1]
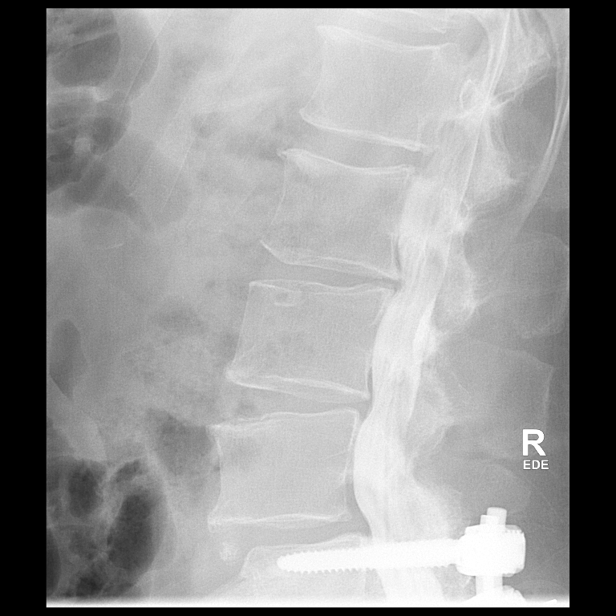

[Series 12: fluoro_myelogram_singleshot_bw · 0.20mm/px · 1 of 1 slices shown (10 of 10)]
[im 1/1]
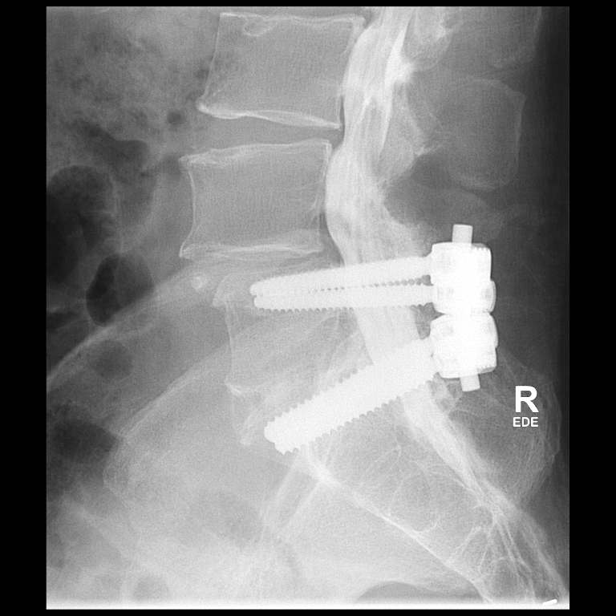

[12 of 12 positions shown; findings below may reference images not displayed]

EXAM:
LUMBAR MYELOGRAM

FLUOROSCOPY TIME:  0.8 minutes corresponding to a Dose Area Product
of 0744 ?Gy*m2

PROCEDURE:
After thorough discussion of risks and benefits of the procedure
including bleeding, infection, injury to nerves, blood vessels,
adjacent structures as well as headache and CSF leak, written and
oral informed consent was obtained. Consent was obtained by
neurosurgeon. Time out form was completed.

Patient was positioned prone on the fluoroscopy table. Local
anesthesia was provided with 1% lidocaine without epinephrine after
prepped and draped in the usual sterile fashion. Puncture was
performed at L2-L3 using a 3 1/2 inch 22-gauge spinal needle via
midline approach. Using a single pass through the dura, the needle
was placed within the thecal sac, with return of clear CSF. 15 mL of
Isovue-M 200 was injected into the thecal sac, with normal
opacification of the nerve roots and cauda equina consistent with
free flow within the subarachnoid space.

Lumbar puncture and intrathecal contrast administration were
performed by Neurosurgeon who will separately report for the portion
of the procedure. I personally supervised acquisition of the
myelogram images.
FINDINGS: LUMBAR MYELOGRAM FINDINGS:

Good opacification lumbar subarachnoid space. Previous L5-S1 PLIF.
Solid appearing arthrodesis.

Mild moderate stenosis at L4-5. Effacement of both L5 nerve roots.
Degenerative anterolisthesis approximately 2 mm at L4-5 related to
posterior element hypertrophy/ adjacent segment disease. Trace
retrolisthesis L3-4 is compensatory. Shallow ventral defect at L2-3.

CT LUMBAR MYELOGRAM FINDINGS:

Segmentation: Normal.

Alignment:  Normal.

Vertebrae: No worrisome osseous lesion.

Conus medullaris: Normal in size, signal, and location.

Paraspinal tissues: No evidence for hydronephrosis or paravertebral
mass.

Disc levels:

L1-L2:  Osseous spurring.  No impingement.

L2-L3: Slight disc space narrowing. Annular bulge. Osseous spurring.
Borderline stenosis. No impingement.

L3-L4:  Vacuum phenomenon.  Schmorl's nodes.  No impingement.

L4-L5: 2 mm facet mediated anterolisthesis. Advanced facet
arthropathy and ligamentum flavum hypertrophy. Annular bulge/
uncovering of the disc. Moderate stenosis. BILATERAL subarticular
zone and foraminal zone narrowing affecting the L4 and L5 nerve
roots. These changes are worse on the LEFT.

L5-S1:  Solid arthrodesis.  Hardware intact.  No impingement.
IMPRESSION: LUMBAR MYELOGRAM IMPRESSION:

Previous L5-S1 PLIF.  No adverse features.

Adjacent segment disease at L4-5. Moderate stenosis with BILATERAL
L5 nerve root impingement.

CT LUMBAR MYELOGRAM IMPRESSION:

Adjacent segment disease at L4-5 is related to 2 mm of facet
mediated slip, posterior element hypertrophy, along with annular
bulge/uncovering of the disc. Moderate stenosis with BILATERAL LEFT
greater than RIGHT L4 and L5 nerve root impingement.

Borderline stenosis at L2-3, without significant subarticular zone
or foraminal zone narrowing.

## 2019-10-14 ENCOUNTER — Other Ambulatory Visit (HOSPITAL_COMMUNITY): Payer: Self-pay | Admitting: Psychiatry

## 2019-10-15 NOTE — Telephone Encounter (Signed)
Schedule with dr. Hisada

## 2019-10-15 NOTE — Telephone Encounter (Signed)
PER PROVIDER REFILL REQUEST: Schedule with dr. Vanetta Shawl ..  L.O.V. 10/28/202  LVM REQUESTING PATIENT TO CALL BACK TO SCHEDULE APPT

## 2019-10-16 NOTE — Progress Notes (Deleted)
[facility] MD/PA/NP OP Progress Note  10/16/2019 2:00 PM Dakota Snow  MRN:  [id number]  Chief Complaint:  HPI: *** Visit Diagnosis: No diagnosis found.  Past Psychiatric History: ***  Past Medical History:  Past Medical History:  Diagnosis Date  . Anxiety   . Arthritis   . Depression    watched son committ suicide  . GERD (gastroesophageal reflux disease)   . Hypertension    no meds  . PONV (postoperative nausea and vomiting)    HAD SOME ISSUES WITH NAUSEA.- THINKS ITS MEDICATION RELATED, NOT ANESTHESIA    Past Surgical History:  Procedure Laterality Date  . APPLICATION OF ROBOTIC ASSISTANCE FOR SPINAL PROCEDURE N/A 02/19/2018   Procedure: APPLICATION OF ROBOTIC ASSISTANCE FOR SPINAL PROCEDURE;  Surgeon: Barnett Abu, MD;  Location: [facility] OR;  Service: Neurosurgery;  Laterality: N/A;  . BACK SURGERY     X 2   . HAND SURGERY    . NECK SURGERY      Family Psychiatric History: ***  Family History:  Family History  Problem Relation Age of Onset  . Anxiety disorder Mother   . Depression Father     Social History:  Social History   Socioeconomic History  . Marital status: Married    Spouse name: Not on file  . Number of children: Not on file  . Years of education: Not on file  . Highest education level: Not on file  Occupational History  . Not on file  Tobacco Use  . Smoking status: Former Smoker    Years: 1.00    Quit date: 07/09/1988    Years since quitting: 31.2  . Smokeless tobacco: Former Neurosurgeon    Types: Chew    Quit date: 11/20/2017  Substance and Sexual Activity  . Alcohol use: Not Currently    Comment: occ  . Drug use: Not on file    Comment: CBD OINTMENT ON BACK  . Sexual activity: Yes    Birth control/protection: Implant  Other Topics Concern  . Not on file  Social History Narrative  . Not on file   Social Determinants of Health   Financial Resource Strain:   . Difficulty of Paying Living Expenses: Not on file  Food Insecurity:   . Worried About  Programme researcher, broadcasting/film/video in the Last Year: Not on file  . Ran Out of Food in the Last Year: Not on file  Transportation Needs:   . Lack of Transportation (Medical): Not on file  . Lack of Transportation (Non-Medical): Not on file  Physical Activity:   . Days of Exercise per Week: Not on file  . Minutes of Exercise per Session: Not on file  Stress:   . Feeling of Stress : Not on file  Social Connections:   . Frequency of Communication with Friends and Family: Not on file  . Frequency of Social Gatherings with Friends and Family: Not on file  . Attends Religious Services: Not on file  . Active Member of Clubs or Organizations: Not on file  . Attends Banker Meetings: Not on file  . Marital Status: Not on file    Allergies: No Known Allergies  Metabolic Disorder Labs: No results found for: HGBA1C, MPG No results found for: PROLACTIN No results found for: CHOL, TRIG, HDL, CHOLHDL, VLDL, LDLCALC No results found for: TSH  Therapeutic Level Labs: No results found for: LITHIUM No results found for: VALPROATE No components found for:  CBMZ  Current Medications: Current Outpatient Medications  Medication  Sig Dispense Refill  . acetaminophen (TYLENOL) 500 MG tablet Take 500 mg by mouth daily as needed for moderate pain.    . Ca Carbonate-Mag Hydroxide (ROLAIDS ANTACID ULTRA STRENGTH PO) Take 1-2 tablets by mouth at bedtime as needed (indigestion/heartburn).     . diazepam (VALIUM) 5 MG tablet TAKE 1/2 TO 1 TABLET BY MOUTH DAILY AS NEEDED FOR ANXIETY 30 tablet 0  . DULoxetine (CYMBALTA) 60 MG capsule Take 1 capsule (60 mg total) by mouth daily. 90 capsule 1  . fluticasone (FLONASE) 50 MCG/ACT nasal spray Place 1 spray into both nostrils daily.    Marland Kitchen lisinopril-hydrochlorothiazide (PRINZIDE,ZESTORETIC) 10-12.5 MG tablet Take 1 tablet by mouth every evening.     No current facility-administered medications for this visit.     Musculoskeletal: Strength & Muscle Tone: N/A Gait  & Station: N/A Patient leans: N/A  Psychiatric Specialty Exam: Review of Systems  There were no vitals taken for this visit.There is no height or weight on file to calculate BMI.  General Appearance: {Appearance:22683}  Eye Contact:  {[facility] EYE CONTACT:22684}  Speech:  Clear and Coherent  Volume:  Normal  Mood:  {B[facility] MOOD:22306}  Affect:  {Affect (PAA):22687}  Thought Process:  Coherent  Orientation:  Full (Time, Place, and Person)  Thought Content: Logical   Suicidal Thoughts:  {ST/HT (PAA):22692}  Homicidal Thoughts:  {ST/HT (PAA):22692}  Memory:  Immediate;   Good  Judgement:  {Judgement (PAA):22694}  Insight:  {Insight (PAA):22695}  Psychomotor Activity:  Normal  Concentration:  Concentration: Good and Attention Span: Good  Recall:  Good  Fund of Knowledge: Good  Language: Good  Akathisia:  No  Handed:  Right  AIMS (if indicated): {Desc; done/not:10129}  Assets:  Communication Skills Desire for Improvement  ADL's:  Intact  Cognition: WNL  Sleep:  {[facility] GOOD/FAIR/POOR:22877}   Screenings: PHQ2-9     Counselor from 09/04/2018 in [facility]  PHQ-2 Total Score  4  PHQ-9 Total Score  21       Assessment and Plan:  Dakota Snow is a 54 y.o. year old male with a history of depression, PTSD, spondylolisthesis L4-L5, lumbar radiculopathy, who presents for follow up appointment for No diagnosis found.   # MDD, mild, recurrent without psychotic features # PTSD  He continues to have depressive symptoms and anxiety, PTSD symptoms in the context of upcoming anniversary of loss of his son by suicide in November 16.  Although he may benefit from up titration of duloxetine, he would like to stay on the current dose.  Will continue duloxetine to target depression and PTSD.  We will continue Valium as needed for anxiety.  Discussed risk of dependence and oversedation.  Validated his grief.  He will continue to have follow-up with Ms.  Bynum for therapy.   Plan 1.Continueduloxetine 60mg  daily  2. Continue valium2.5 mg - 5 mg daily as needed for anxiety(he declines refill) 3.Next appointment:in December - font desk to contact for therapy follow up  Past trials of medication:duloxetine, valium (may have tried sertraline in the past, which made him feel weird)   The patient demonstrates the following risk factors for suicide: Chronic risk factors for suicide include:psychiatric disorder ofdepressionand completed suicide in a family member. Acute risk factorsfor suicide include: unemployment, loss (financial, interpersonal, professional) and recent discharge from inpatient psychiatry. Protective factorsfor this patient include: positive social support, responsibility to others (children, family), coping skills and hope for the future. Considering these factors, the overall suicide risk at this  point appears to below. Patientisappropriate for outpatient follow up. Guns were removed by his wife.  [name], MD 10/16/2019, 2:01 PM

## 2019-10-21 ENCOUNTER — Encounter (HOSPITAL_COMMUNITY): Payer: Self-pay | Admitting: Psychiatry

## 2019-10-21 ENCOUNTER — Other Ambulatory Visit: Payer: Self-pay

## 2019-10-21 ENCOUNTER — Ambulatory Visit (INDEPENDENT_AMBULATORY_CARE_PROVIDER_SITE_OTHER): Payer: Medicare Other | Admitting: Psychiatry

## 2019-10-21 ENCOUNTER — Ambulatory Visit (HOSPITAL_COMMUNITY): Payer: Medicare Other | Admitting: Psychiatry

## 2019-10-21 DIAGNOSIS — F4310 Post-traumatic stress disorder, unspecified: Secondary | ICD-10-CM

## 2019-10-21 DIAGNOSIS — F330 Major depressive disorder, recurrent, mild: Secondary | ICD-10-CM

## 2019-10-21 MED ORDER — DIAZEPAM 5 MG PO TABS: ORAL_TABLET | ORAL | 1 refills | Status: DC

## 2019-10-21 MED ORDER — DULOXETINE HCL 60 MG PO CPEP: 60.0000 mg | ORAL_CAPSULE | Freq: Every day | ORAL | 1 refills | Status: DC

## 2019-10-21 NOTE — Patient Instructions (Signed)
1.Continueduloxetine 60mg  daily  2. Continue valium2.5 mg - 5 mg daily as needed for anxiety(he declines refill) 3.Next appointment:4/5 at 11:30

## 2019-10-21 NOTE — Progress Notes (Signed)
Virtual Visit via Telephone Note  I connected with Dakota Snow on 10/21/19 at  1:20 PM EST by telephone and verified that I am speaking with the correct person using two identifiers.   I discussed the limitations, risks, security and privacy concerns of performing an evaluation and management service by telephone and the availability of in person appointments. I also discussed with the patient that there may be a patient responsible charge related to this service. The patient expressed understanding and agreed to proceed.     I discussed the assessment and treatment plan with the patient. The patient was provided an opportunity to ask questions and all were answered. The patient agreed with the plan and demonstrated an understanding of the instructions.   The patient was advised to call back or seek an in-person evaluation if the symptoms worsen or if the condition fails to improve as anticipated.  I provided 21 minutes of non-face-to-face time during this encounter.   [name], MD    [facility] MD/PA/NP OP Progress Note  10/21/2019 2:18 PM Dakota Snow  MRN:  [id number]  Chief Complaint:  Chief Complaint    Anxiety; Follow-up; Depression     HPI:  This is a follow-up appointment for PTSD and depression.  He states that he has been stressful especially around the holiday season.  Although he was doing better a year prior, he was more difficult for him due to him thinking about his son.  He was drinking 8-10 mixed drinks, a few times a week.  He stopped doing routine.  He noticed that it has been destroying his life, and he has just started to get back on his walking routine.  He drinks a beer every few weeks.  He states that he does not think he will get back to drink again as he is unable to handle things physically due to his back issues, while he admits it was one of his ways to handle stresses.  He has occasional insomnia.  He feels depressed.  He has fair concentration.  He has  fair motivation and energy.  He denies SI.  He feels anxious at times.  He denies panic attacks.  He was taking Valium more often during the holiday season for anxiety, although he denies taking it with alcohol. He denies nightmares. He has flashback. He denies hypervigilance.    Visit Diagnosis:    ICD-10-CM   1. MDD (major depressive disorder), recurrent episode, mild (HCC)  F33.0   2. PTSD (post-traumatic stress disorder)  F43.10     Past Psychiatric History: Please see initial evaluation for full details. I have reviewed the history. No updates at this time.     Past Medical History:  Past Medical History:  Diagnosis Date  . Anxiety   . Arthritis   . Depression    watched son committ suicide  . GERD (gastroesophageal reflux disease)   . Hypertension    no meds  . PONV (postoperative nausea and vomiting)    HAD SOME ISSUES WITH NAUSEA.- THINKS ITS MEDICATION RELATED, NOT ANESTHESIA    Past Surgical History:  Procedure Laterality Date  . APPLICATION OF ROBOTIC ASSISTANCE FOR SPINAL PROCEDURE N/A 02/19/2018   Procedure: APPLICATION OF ROBOTIC ASSISTANCE FOR SPINAL PROCEDURE;  Surgeon: Barnett Abu, MD;  Location: [facility] OR;  Service: Neurosurgery;  Laterality: N/A;  . BACK SURGERY     X 2   . HAND SURGERY    . NECK SURGERY      Family  Psychiatric History: Please see initial evaluation for full details. I have reviewed the history. No updates at this time.     Family History:  Family History  Problem Relation Age of Onset  . Anxiety disorder Mother   . Depression Father     Social History:  Social History   Socioeconomic History  . Marital status: Married    Spouse name: Not on file  . Number of children: Not on file  . Years of education: Not on file  . Highest education level: Not on file  Occupational History  . Not on file  Tobacco Use  . Smoking status: Former Smoker    Years: 1.00    Quit date: 07/09/1988    Years since quitting: 31.3  . Smokeless  tobacco: Former Systems developer    Types: [place] date: 11/20/2017  Substance and Sexual Activity  . Alcohol use: Not Currently    Comment: occ  . Drug use: Not on file    Comment: CBD OINTMENT ON BACK  . Sexual activity: Yes    Birth control/protection: Implant  Other Topics Concern  . Not on file  Social History Narrative  . Not on file   Social Determinants of Health   Financial Resource Strain:   . Difficulty of Paying Living Expenses: Not on file  Food Insecurity:   . Worried About Charity fundraiser in the Last Year: Not on file  . Ran Out of Food in the Last Year: Not on file  Transportation Needs:   . Lack of Transportation (Medical): Not on file  . Lack of Transportation (Non-Medical): Not on file  Physical Activity:   . Days of Exercise per Week: Not on file  . Minutes of Exercise per Session: Not on file  Stress:   . Feeling of Stress : Not on file  Social Connections:   . Frequency of Communication with Friends and Family: Not on file  . Frequency of Social Gatherings with Friends and Family: Not on file  . Attends Religious Services: Not on file  . Active Member of Clubs or Organizations: Not on file  . Attends Archivist Meetings: Not on file  . Marital Status: Not on file    Allergies: No Known Allergies  Metabolic Disorder Labs: No results found for: HGBA1C, MPG No results found for: PROLACTIN No results found for: CHOL, TRIG, HDL, CHOLHDL, VLDL, LDLCALC No results found for: TSH  Therapeutic Level Labs: No results found for: LITHIUM No results found for: VALPROATE No components found for:  CBMZ  Current Medications: Current Outpatient Medications  Medication Sig Dispense Refill  . acetaminophen (TYLENOL) 500 MG tablet Take 500 mg by mouth daily as needed for moderate pain.    . Ca Carbonate-Mag Hydroxide (ROLAIDS ANTACID ULTRA STRENGTH PO) Take 1-2 tablets by mouth at bedtime as needed (indigestion/heartburn).     Derrill Memo ON 11/11/2019]  diazepam (VALIUM) 5 MG tablet TAKE 1/2 TO 1 TABLET BY MOUTH DAILY AS NEEDED FOR ANXIETY 30 tablet 1  . DULoxetine (CYMBALTA) 60 MG capsule Take 1 capsule (60 mg total) by mouth daily. 90 capsule 1  . fluticasone (FLONASE) 50 MCG/ACT nasal spray Place 1 spray into both nostrils daily.    Marland Kitchen lisinopril-hydrochlorothiazide (PRINZIDE,ZESTORETIC) 10-12.5 MG tablet Take 1 tablet by mouth every evening.     No current facility-administered medications for this visit.     Musculoskeletal: Strength & Muscle Tone: N/A Gait & Station: N/A Patient leans: N/A  Psychiatric Specialty Exam: Review of Systems  Psychiatric/Behavioral: Positive for dysphoric mood. Negative for agitation, behavioral problems, confusion, decreased concentration, hallucinations, self-injury, sleep disturbance and suicidal ideas. The patient is nervous/anxious. The patient is not hyperactive.   All other systems reviewed and are negative.   There were no vitals taken for this visit.There is no height or weight on file to calculate BMI.  General Appearance: NA  Eye Contact:  NA  Speech:  Clear and Coherent  Volume:  Normal  Mood:  Depressed  Affect:  NA  Thought Process:  Coherent  Orientation:  Full (Time, Place, and Person)  Thought Content: Logical   Suicidal Thoughts:  No  Homicidal Thoughts:  No  Memory:  Immediate;   Good  Judgement:  Good  Insight:  Good  Psychomotor Activity:  Normal  Concentration:  Concentration: Good and Attention Span: Good  Recall:  Good  Fund of Knowledge: Good  Language: Good  Akathisia:  No  Handed:  Right  AIMS (if indicated): not done  Assets:  Communication Skills Desire for Improvement  ADL's:  Intact  Cognition: WNL  Sleep:  Fair   Screenings: PHQ2-9     Counselor from 09/04/2018 in [facility]  PHQ-2 Total Score  4  PHQ-9 Total Score  21       Assessment and Plan:  Dakota Snow is a 54 y.o. year old male with a  history of depression, PTSD,spondylolisthesis L4-L5, lumbar radiculopathy , who presents for follow up appointment for MDD (major depressive disorder), recurrent episode, mild (HCC)  PTSD (post-traumatic stress disorder)  # MDD, mild, recurrent without psychotic features # PTSD Although he reports slight worsening in depressive symptoms in holiday seasons, he has been improving over the past month.  Psychosocial stressors includes grief of loss of his son by suicide on November 16.  We will continue duloxetine to target depression and PTSD.  We will continue Valium as needed for anxiety.  Discussed risk of dependence and oversedation, especially with concomitant use of alcohol.  Although he will greatly benefit from therapy, he would like to hold it for now until he is able to see a therapist in person.   # Alcohol use He reports escalated use of alcohol during holiday season, although it has been improving over the past month.  He has no known significant history of alcohol use except occasional binge drinking and using alcohol as a coping mechanism. He is not interested in pharmacological option at this time.  We will continue to monitor.   Plan I have reviewed and updated plans as below 1.Continueduloxetine 60mg  daily  2. Continue valium2.5 mg - 5 mg daily as needed for anxiety 3.Next appointment:4/5 at 11:30 for 30 mins, video  Past trials of medication:duloxetine, valium (may have tried sertraline in the past, which made him feel weird)  .I have reviewed suicide assessment in detail. No change in the following assessment.   The patient demonstrates the following risk factors for suicide: Chronic risk factors for suicide include:psychiatric disorder ofdepressionand completed suicide in a family member. Acute risk factorsfor suicide include: unemployment, loss (financial, interpersonal, professional) and recent discharge from inpatient psychiatry. Protective factorsfor this  patient include: positive social support, responsibility to others (children, family), coping skills and hope for the future. Considering these factors, the overall suicide risk at this point appears to below. Patientisappropriate for outpatient follow up. Guns were removed by his wife.   , MD 10/21/2019, 2:18 PM

## 2019-12-03 NOTE — Progress Notes (Addendum)
Virtual Visit via Video Note  I connected with Dakota Snow on 12/09/19 at 11:30 AM EDT by a video enabled telemedicine application and verified that I am speaking with the correct person using two identifiers.   I discussed the limitations of evaluation and management by telemedicine and the availability of in person appointments. The patient expressed understanding and agreed to proceed.     I discussed the assessment and treatment plan with the patient. The patient was provided an opportunity to ask questions and all were answered. The patient agreed with the plan and demonstrated an understanding of the instructions.   The patient was advised to call back or seek an in-person evaluation if the symptoms worsen or if the condition fails to improve as anticipated.  I provided 25 minutes of non-face-to-face time during this encounter.   [name], MD    [facility] MD/PA/NP OP Progress Note  12/09/2019 12:13 PM Dakota Snow  MRN:  [id number]  Chief Complaint:  Chief Complaint    Depression; Follow-up     HPI:  This is a follow-up appointment for depression and PTSD.  He states that he has been doing a little better compared to a few months ago. He believes getting back on routine is a key. He enjoyed visitation of his brother. They are "best friends," and he feels down since his brother/family left a week ago. He needs to force himself to do things due to fatigue, although it used to be easier several months ago. He talks about his wife, who is now having more difficult time.  He tries to be there for her, although he sometimes feels he is not capable doing so. He talks about Dakota Snow, his son with his brother or his local friend, whose son deceased 17 years ago. He has worsening in middle insomnia. He has low energy. He has mild anhedonia. He has fair appetite.  He has passive SI.  He feels less anxious. He denies panic attacks. He denies nightmares. He has flashback. He denies  hypervigilance. He drank 24 beers per week when his brother stayed with him. He recognizes it was "a lot" and he denies any drinking since then. He denies craving for alcohol.      Visit Diagnosis:    ICD-10-CM   1. PTSD (post-traumatic stress disorder)  F43.10   2. MDD (major depressive disorder), recurrent episode, mild (HCC)  F33.0     Past Psychiatric History: Please see initial evaluation for full details. I have reviewed the history. No updates at this time.     Past Medical History:  Past Medical History:  Diagnosis Date  . Anxiety   . Arthritis   . Depression    watched son committ suicide  . GERD (gastroesophageal reflux disease)   . Hypertension    no meds  . PONV (postoperative nausea and vomiting)    HAD SOME ISSUES WITH NAUSEA.- THINKS ITS MEDICATION RELATED, NOT ANESTHESIA    Past Surgical History:  Procedure Laterality Date  . APPLICATION OF ROBOTIC ASSISTANCE FOR SPINAL PROCEDURE N/A 02/19/2018   Procedure: APPLICATION OF ROBOTIC ASSISTANCE FOR SPINAL PROCEDURE;  Surgeon: Barnett Abu, MD;  Location: [facility] OR;  Service: Neurosurgery;  Laterality: N/A;  . BACK SURGERY     X 2   . HAND SURGERY    . NECK SURGERY      Family Psychiatric History: Please see initial evaluation for full details. I have reviewed the history. No updates at this time.  Family History:  Family History  Problem Relation Age of Onset  . Anxiety disorder Mother   . Depression Father     Social History:  Social History   Socioeconomic History  . Marital status: Married    Spouse name: Not on file  . Number of children: Not on file  . Years of education: Not on file  . Highest education level: Not on file  Occupational History  . Not on file  Tobacco Use  . Smoking status: Former Smoker    Years: 1.00    Quit date: 07/09/1988    Years since quitting: 31.4  . Smokeless tobacco: Former Systems developer    Types: [place] date: 11/20/2017  Substance and Sexual Activity  .  Alcohol use: Not Currently    Comment: occ  . Drug use: Not on file    Comment: CBD OINTMENT ON BACK  . Sexual activity: Yes    Birth control/protection: Implant  Other Topics Concern  . Not on file  Social History Narrative  . Not on file   Social Determinants of Health   Financial Resource Strain:   . Difficulty of Paying Living Expenses:   Food Insecurity:   . Worried About Charity fundraiser in the Last Year:   . Arboriculturist in the Last Year:   Transportation Needs:   . Film/video editor (Medical):   Marland Kitchen Lack of Transportation (Non-Medical):   Physical Activity:   . Days of Exercise per Week:   . Minutes of Exercise per Session:   Stress:   . Feeling of Stress :   Social Connections:   . Frequency of Communication with Friends and Family:   . Frequency of Social Gatherings with Friends and Family:   . Attends Religious Services:   . Active Member of Clubs or Organizations:   . Attends Archivist Meetings:   Marland Kitchen Marital Status:     Allergies: No Known Allergies  Metabolic Disorder Labs: No results found for: HGBA1C, MPG No results found for: PROLACTIN No results found for: CHOL, TRIG, HDL, CHOLHDL, VLDL, LDLCALC No results found for: TSH  Therapeutic Level Labs: No results found for: LITHIUM No results found for: VALPROATE No components found for:  CBMZ  Current Medications: Current Outpatient Medications  Medication Sig Dispense Refill  . acetaminophen (TYLENOL) 500 MG tablet Take 500 mg by mouth daily as needed for moderate pain.    . Ca Carbonate-Mag Hydroxide (ROLAIDS ANTACID ULTRA STRENGTH PO) Take 1-2 tablets by mouth at bedtime as needed (indigestion/heartburn).     . diazepam (VALIUM) 5 MG tablet TAKE 1/2 TO 1 TABLET BY MOUTH DAILY AS NEEDED FOR ANXIETY 30 tablet 1  . DULoxetine (CYMBALTA) 60 MG capsule Take 1 capsule (60 mg total) by mouth daily. 90 capsule 1  . fluticasone (FLONASE) 50 MCG/ACT nasal spray Place 1 spray into both  nostrils daily.    Marland Kitchen lisinopril-hydrochlorothiazide (PRINZIDE,ZESTORETIC) 10-12.5 MG tablet Take 1 tablet by mouth every evening.     No current facility-administered medications for this visit.     Musculoskeletal: Strength & Muscle Tone: N/A Gait & Station: N/A Patient leans: N/A  Psychiatric Specialty Exam: Review of Systems  Psychiatric/Behavioral: Positive for dysphoric mood and sleep disturbance. Negative for agitation, behavioral problems, confusion, decreased concentration, hallucinations, self-injury and suicidal ideas. The patient is not nervous/anxious and is not hyperactive.   All other systems reviewed and are negative.   There were no vitals taken for  this visit.There is no height or weight on file to calculate BMI.  General Appearance: Fairly Groomed  Eye Contact:  Good  Speech:  Clear and Coherent  Volume:  Normal  Mood:  "better"  Affect:  Appropriate, Congruent and slightly down  Thought Process:  Coherent  Orientation:  Full (Time, Place, and Person)  Thought Content: Logical   Suicidal Thoughts:  No  Homicidal Thoughts:  No  Memory:  Immediate;   Good  Judgement:  Good  Insight:  Good  Psychomotor Activity:  Normal  Concentration:  Concentration: Good and Attention Span: Good  Recall:  Good  Fund of Knowledge: Good  Language: Good  Akathisia:  No  Handed:  Right  AIMS (if indicated): not done  Assets:  Communication Skills Desire for Improvement  ADL's:  Intact  Cognition: WNL  Sleep:  Poor   Screenings: PHQ2-9     Counselor from 09/04/2018 in [facility]  PHQ-2 Total Score  4  PHQ-9 Total Score  21       Assessment and Plan:  Dakota Snow is a 54 y.o. year old male with a history of  Depression, PTSD, spondylolisthesis  L4-L5, lumbar radiculopathy , who presents for follow up appointment for PTSD (post-traumatic stress disorder)  MDD (major depressive disorder), recurrent episode, mild  (HCC)  # MDD, mild, recurrent without psychotic features # PTSD There has been slight improvement in depressive and PTSD symptoms since the last visit.  Psychosocial stressors includes grief of loss of his son by suicide on November 16.  Although she may benefit from up titration of duloxetine, he prefers to stay on the current dose.  Provided psychoeducation about optimization for treatment.  Will continue Valium as needed for anxiety.  Discussed risk of dependence and oversedation.    # Alcohol use  Although he did have escalated use of alcohol when his brother visited the patient, he denies any use since then. Although he has no known significant history of alcohol use, he has an episodes of binge drinking as a coping mechanism.  He is amenable to pharmacological option if any worsening in alcohol use. Will continue to monitor.   Plan I have reviewed and updated plans as below 1.Continueduloxetine 60mg  daily  2. Continue valium2.5 mg - 5 mg daily as needed for anxiety  3.Next appointment:5/27 at 9 AM for 30 mins, video - he would like to hold therapy until he is able to see in person  Past trials of medication:duloxetine, valium (may have tried sertraline in the past, which made him feel weird)   The patient demonstrates the following risk factors for suicide: Chronic risk factors for suicide include:psychiatric disorder ofdepressionand completed suicide in a family member. Acute risk factorsfor suicide include: unemployment, loss (financial, interpersonal, professional) and recent discharge from inpatient psychiatry. Protective factorsfor this patient include: positive social support, responsibility to others (children, family), coping skills and hope for the future. Considering these factors, the overall suicide risk at this point appears to below. Patientisappropriate for outpatient follow up. Guns were removed by his wife.   , MD 12/09/2019, 12:13 PM

## 2019-12-09 ENCOUNTER — Other Ambulatory Visit: Payer: Self-pay

## 2019-12-09 ENCOUNTER — Ambulatory Visit (INDEPENDENT_AMBULATORY_CARE_PROVIDER_SITE_OTHER): Payer: Medicare Other | Admitting: Psychiatry

## 2019-12-09 ENCOUNTER — Encounter (HOSPITAL_COMMUNITY): Payer: Self-pay | Admitting: Psychiatry

## 2019-12-09 DIAGNOSIS — F4310 Post-traumatic stress disorder, unspecified: Secondary | ICD-10-CM

## 2019-12-09 DIAGNOSIS — F330 Major depressive disorder, recurrent, mild: Secondary | ICD-10-CM

## 2020-01-27 NOTE — Progress Notes (Deleted)
[facility] MD/PA/NP OP Progress Note  01/27/2020 11:19 AM TATSUYA OKRAY  MRN:  [id number]  Chief Complaint:  HPI: *** Visit Diagnosis: No diagnosis found.  Past Psychiatric History: Please see initial evaluation for full details. I have reviewed the history. No updates at this time.     Past Medical History:  Past Medical History:  Diagnosis Date  . Anxiety   . Arthritis   . Depression    watched son committ suicide  . GERD (gastroesophageal reflux disease)   . Hypertension    no meds  . PONV (postoperative nausea and vomiting)    HAD SOME ISSUES WITH NAUSEA.- THINKS ITS MEDICATION RELATED, NOT ANESTHESIA    Past Surgical History:  Procedure Laterality Date  . APPLICATION OF ROBOTIC ASSISTANCE FOR SPINAL PROCEDURE N/A 02/19/2018   Procedure: APPLICATION OF ROBOTIC ASSISTANCE FOR SPINAL PROCEDURE;  Surgeon: Barnett Abu, MD;  Location: [facility] OR;  Service: Neurosurgery;  Laterality: N/A;  . BACK SURGERY     X 2   . HAND SURGERY    . NECK SURGERY      Family Psychiatric History: Please see initial evaluation for full details. I have reviewed the history. No updates at this time.     Family History:  Family History  Problem Relation Age of Onset  . Anxiety disorder Mother   . Depression Father     Social History:  Social History   Socioeconomic History  . Marital status: Married    Spouse name: Not on file  . Number of children: Not on file  . Years of education: Not on file  . Highest education level: Not on file  Occupational History  . Not on file  Tobacco Use  . Smoking status: Former Smoker    Years: 1.00    Quit date: 07/09/1988    Years since quitting: 31.5  . Smokeless tobacco: Former Neurosurgeon    Types: Chew    Quit date: 11/20/2017  Substance and Sexual Activity  . Alcohol use: Not Currently    Comment: occ  . Drug use: Not on file    Comment: CBD OINTMENT ON BACK  . Sexual activity: Yes    Birth control/protection: Implant  Other Topics Concern  . Not  on file  Social History Narrative  . Not on file   Social Determinants of Health   Financial Resource Strain:   . Difficulty of Paying Living Expenses:   Food Insecurity:   . Worried About Programme researcher, broadcasting/film/video in the Last Year:   . Barista in the Last Year:   Transportation Needs:   . Freight forwarder (Medical):   Marland Kitchen Lack of Transportation (Non-Medical):   Physical Activity:   . Days of Exercise per Week:   . Minutes of Exercise per Session:   Stress:   . Feeling of Stress :   Social Connections:   . Frequency of Communication with Friends and Family:   . Frequency of Social Gatherings with Friends and Family:   . Attends Religious Services:   . Active Member of Clubs or Organizations:   . Attends Banker Meetings:   Marland Kitchen Marital Status:     Allergies: No Known Allergies  Metabolic Disorder Labs: No results found for: HGBA1C, MPG No results found for: PROLACTIN No results found for: CHOL, TRIG, HDL, CHOLHDL, VLDL, LDLCALC No results found for: TSH  Therapeutic Level Labs: No results found for: LITHIUM No results found for: VALPROATE No components found for:  CBMZ  Current Medications: Current Outpatient Medications  Medication Sig Dispense Refill  . acetaminophen (TYLENOL) 500 MG tablet Take 500 mg by mouth daily as needed for moderate pain.    . Ca Carbonate-Mag Hydroxide (ROLAIDS ANTACID ULTRA STRENGTH PO) Take 1-2 tablets by mouth at bedtime as needed (indigestion/heartburn).     . diazepam (VALIUM) 5 MG tablet TAKE 1/2 TO 1 TABLET BY MOUTH DAILY AS NEEDED FOR ANXIETY 30 tablet 1  . DULoxetine (CYMBALTA) 60 MG capsule Take 1 capsule (60 mg total) by mouth daily. 90 capsule 1  . fluticasone (FLONASE) 50 MCG/ACT nasal spray Place 1 spray into both nostrils daily.    Marland Kitchen lisinopril-hydrochlorothiazide (PRINZIDE,ZESTORETIC) 10-12.5 MG tablet Take 1 tablet by mouth every evening.     No current facility-administered medications for this visit.      Musculoskeletal: Strength & Muscle Tone: N/A Gait & Station: N/A Patient leans: N/A  Psychiatric Specialty Exam: Review of Systems  There were no vitals taken for this visit.There is no height or weight on file to calculate BMI.  General Appearance: {Appearance:22683}  Eye Contact:  {BHH EYE CONTACT:22684}  Speech:  Clear and Coherent  Volume:  Normal  Mood:  {BHH MOOD:22306}  Affect:  {Affect (PAA):22687}  Thought Process:  Coherent  Orientation:  Full (Time, Place, and Person)  Thought Content: Logical   Suicidal Thoughts:  {ST/HT (PAA):22692}  Homicidal Thoughts:  {ST/HT (PAA):22692}  Memory:  Immediate;   Good  Judgement:  {Judgement (PAA):22694}  Insight:  {Insight (PAA):22695}  Psychomotor Activity:  Normal  Concentration:  Concentration: Good and Attention Span: Good  Recall:  Good  Fund of Knowledge: Good  Language: Good  Akathisia:  No  Handed:  Right  AIMS (if indicated): not done  Assets:  Communication Skills Desire for Improvement  ADL's:  Intact  Cognition: WNL  Sleep:  {BHH GOOD/FAIR/POOR:22877}   Screenings: PHQ2-9     Counselor from 09/04/2018 in [facility]  PHQ-2 Total Score  4  PHQ-9 Total Score  21       Assessment and Plan:  KRU ALLMAN is a 54 y.o. year old male with a history of depression, PTSD,  spondylolisthesis  L4-L5, lumbar radiculopathy, who presents for follow up appointment for below.      # MDD, mild, recurrent without psychotic features # PTSD There has been slight improvement in depressive and PTSD symptoms since the last visit.  Psychosocial stressors includes grief of loss of his son by suicide on November 16.  Although she may benefit from up titration of duloxetine, he prefers to stay on the current dose.  Provided psychoeducation about optimization for treatment.  Will continue Valium as needed for anxiety.  Discussed risk of dependence and oversedation.    #  Alcohol use  Although he did have escalated use of alcohol when his brother visited the patient, he denies any use since then. Although he has no known significant history of alcohol use, he has an episodes of binge drinking as a coping mechanism.  He is amenable to pharmacological option if any worsening in alcohol use. Will continue to monitor.   Plan  1.Continueduloxetine 60mg  daily  2. Continue valium2.5 mg - 5 mg daily as needed for anxiety  3.Next appointment:5/27 at 9 AM for 30 mins, video - he would like to hold therapy until he is able to see in person  Past trials of medication:duloxetine, valium (may have tried sertraline in the past, which made him feel  weird)   The patient demonstrates the following risk factors for suicide: Chronic risk factors for suicide include:psychiatric disorder ofdepressionand completed suicide in a family member. Acute risk factorsfor suicide include: unemployment, loss (financial, interpersonal, professional) and recent discharge from inpatient psychiatry. Protective factorsfor this patient include: positive social support, responsibility to others (children, family), coping skills and hope for the future. Considering these factors, the overall suicide risk at this point appears to below. Patientisappropriate for outpatient follow up. Guns were removed by his wife.   [name], MD 01/27/2020, 11:19 AM

## 2020-01-30 ENCOUNTER — Telehealth (HOSPITAL_COMMUNITY): Payer: Medicare Other | Admitting: Psychiatry

## 2020-01-30 ENCOUNTER — Ambulatory Visit (HOSPITAL_COMMUNITY): Payer: Medicare Other | Admitting: Psychiatry

## 2020-01-30 ENCOUNTER — Other Ambulatory Visit: Payer: Self-pay

## 2020-02-08 NOTE — Progress Notes (Addendum)
Virtual Visit via Video Note  I connected with [name] on 02/10/20 at  8:30 AM EDT by a video enabled telemedicine application and verified that I am speaking with the correct person using two identifiers.   I discussed the limitations of evaluation and management by telemedicine and the availability of in person appointments. The patient expressed understanding and agreed to proceed.   I discussed the assessment and treatment plan with the patient. The patient was provided an opportunity to ask questions and all were answered. The patient agreed with the plan and demonstrated an understanding of the instructions.   The patient was advised to call back or seek an in-person evaluation if the symptoms worsen or if the condition fails to improve as anticipated.  Location: patient- home, provider- office   I provided 15 minutes of non-face-to-face time during this encounter.   [name], MD    [facility] MD/PA/NP OP Progress Note  02/10/2020 9:03 AM Dakota Snow  MRN:  [id number]  Chief Complaint:  Chief Complaint    Follow-up; Depression; Anxiety     HPI:  This is a follow-up appointment for depression and PTSD.  He states that he has more better days since the last visit.  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  His family visited to attend the graduation. His daughter may go to community college.  He reports good relationship with his daughter.  He does not recall what he did on the birthday of his son, stating that he did not do anything special. His wife is struggling more lately.  He has insomnia, which she attributes to the pain.  He feels less depressed.  He has fair motivation and energy.  He has fair appetite.  He denies SI.  He was anxious around the birthday of his son. He denies panic attacks. He denies flashback, nightmares or hypervigilance. He drinks a beer (He drank up to six beers when his family visited) a few times per week. He  denies craving for alcohol. He is hoping to get back to routine of taking a walk six times a week. He took valium around the birthday of his son for anxiety.    Visit Diagnosis:    ICD-10-CM   1. PTSD (post-traumatic stress disorder)  F43.10   2. MDD (major depressive disorder), recurrent episode, mild (HCC)  F33.0     Past Psychiatric History: Please see initial evaluation for full details. I have reviewed the history. No updates at this time.     Past Medical History:  Past Medical History:  Diagnosis Date  . Anxiety   . Arthritis   . Depression    watched son committ suicide  . GERD (gastroesophageal reflux disease)   . Hypertension    no meds  . PONV (postoperative nausea and vomiting)    HAD SOME ISSUES WITH NAUSEA.- THINKS ITS MEDICATION RELATED, NOT ANESTHESIA    Past Surgical History:  Procedure Laterality Date  . APPLICATION OF ROBOTIC ASSISTANCE FOR SPINAL PROCEDURE N/A 02/19/2018   Procedure: APPLICATION OF ROBOTIC ASSISTANCE FOR SPINAL PROCEDURE;  Surgeon: Barnett Abu, MD;  Location: [facility] OR;  Service: Neurosurgery;  Laterality: N/A;  . BACK SURGERY     X 2   . HAND SURGERY    . NECK SURGERY      Family Psychiatric History: Please see initial evaluation for full details. I have reviewed the history. No updates at this time.     Family History:  Family History  Problem Relation Age of Onset  . Anxiety disorder Mother   . Depression Father     Social History:  Social History   Socioeconomic History  . Marital status: Married    Spouse name: Not on file  . Number of children: Not on file  . Years of education: Not on file  . Highest education level: Not on file  Occupational History  . Not on file  Tobacco Use  . Smoking status: Former Smoker    Years: 1.00    Quit date: 07/09/1988    Years since quitting: 31.6  . Smokeless tobacco: Former Neurosurgeon    Types: Chew    Quit date: 11/20/2017  Substance and Sexual Activity  . Alcohol use: Not Currently     Comment: occ  . Drug use: Not on file    Comment: CBD OINTMENT ON BACK  . Sexual activity: Yes    Birth control/protection: Implant  Other Topics Concern  . Not on file  Social History Narrative  . Not on file   Social Determinants of Health   Financial Resource Strain:   . Difficulty of Paying Living Expenses:   Food Insecurity:   . Worried About Programme researcher, broadcasting/film/video in the Last Year:   . Barista in the Last Year:   Transportation Needs:   . Freight forwarder (Medical):   Marland Kitchen Lack of Transportation (Non-Medical):   Physical Activity:   . Days of Exercise per Week:   . Minutes of Exercise per Session:   Stress:   . Feeling of Stress :   Social Connections:   . Frequency of Communication with Friends and Family:   . Frequency of Social Gatherings with Friends and Family:   . Attends Religious Services:   . Active Member of Clubs or Organizations:   . Attends Banker Meetings:   Marland Kitchen Marital Status:     Allergies: No Known Allergies  Metabolic Disorder Labs: No results found for: HGBA1C, MPG No results found for: PROLACTIN No results found for: CHOL, TRIG, HDL, CHOLHDL, VLDL, LDLCALC No results found for: TSH  Therapeutic Level Labs: No results found for: LITHIUM No results found for: VALPROATE No components found for:  CBMZ  Current Medications: Current Outpatient Medications  Medication Sig Dispense Refill  . acetaminophen (TYLENOL) 500 MG tablet Take 500 mg by mouth daily as needed for moderate pain.    . Ca Carbonate-Mag Hydroxide (ROLAIDS ANTACID ULTRA STRENGTH PO) Take 1-2 tablets by mouth at bedtime as needed (indigestion/heartburn).     . diazepam (VALIUM) 5 MG tablet TAKE 1/2 TO 1 TABLET BY MOUTH DAILY AS NEEDED FOR ANXIETY 30 tablet 1  . [START ON 04/05/2020] DULoxetine (CYMBALTA) 60 MG capsule Take 1 capsule (60 mg total) by mouth daily. 90 capsule 0  . fluticasone (FLONASE) 50 MCG/ACT nasal spray Place 1 spray into both nostrils  daily.    Marland Kitchen lisinopril-hydrochlorothiazide (PRINZIDE,ZESTORETIC) 10-12.5 MG tablet Take 1 tablet by mouth every evening.     No current facility-administered medications for this visit.     Musculoskeletal: Strength & Muscle Tone: N/A Gait & Station: N/A Patient leans: N/A  Psychiatric Specialty Exam: Review of Systems  Psychiatric/Behavioral: Positive for sleep disturbance. Negative for agitation, behavioral problems, confusion, decreased concentration, dysphoric mood, hallucinations, self-injury and suicidal ideas. The patient is nervous/anxious. The patient is not hyperactive.   All other systems reviewed and are negative.   There were no vitals taken for this  visit.There is no height or weight on file to calculate BMI.  General Appearance: Fairly Groomed  Eye Contact:  Good  Speech:  Clear and Coherent  Volume:  Normal  Mood:  good  Affect:  Appropriate, Congruent and down  Thought Process:  Coherent  Orientation:  Full (Time, Place, and Person)  Thought Content: Logical   Suicidal Thoughts:  No  Homicidal Thoughts:  No  Memory:  Immediate;   Good  Judgement:  Good  Insight:  Good  Psychomotor Activity:  Normal  Concentration:  Concentration: Good and Attention Span: Good  Recall:  Good  Fund of Knowledge: Good  Language: Good  Akathisia:  No  Handed:  Right  AIMS (if indicated): not done  Assets:  Communication Skills Desire for Improvement  ADL's:  Intact  Cognition: WNL  Sleep:  Poor   Screenings: PHQ2-9     Counselor from 09/04/2018 in [facility]  PHQ-2 Total Score  4  PHQ-9 Total Score  21       Assessment and Plan:  Dakota Snow is a 54 y.o. year old male with a history of  Depression, PTSD, spondylolisthesis  L4-L5, lumbar radiculopathy , who presents for follow up appointment for below.   1. MDD (major depressive disorder), recurrent episode, mild ([place]) 2. PTSD (post-traumatic stress disorder) There  has been more improvement in PTSD and depressive symptoms since the last visit. Psychosocial stressors includes grief of loss of his son by suicide on November 16.   Will continue current dose of duloxetine to target depression and PTSD.  We will continue Valium as needed for anxiety.  Discussed risk of dependence and oversedation.     # Alcohol use  No significant signs of alcohol abuse, although he has a history of binge drinking as a coping mechanism.  We will continue to monitor.   Plan I have reviewed and updated plans as below 1.Continueduloxetine 60mg  daily  2. Continue valium2.5 mg - 5 mg daily as needed for anxiety (he declined refill) 3.Next appointment:9/3 at 9:10 for 20 mins, video - he would like to hold therapy until he is able to see in person  Past trials of medication:duloxetine, valium (may have tried sertraline in the past, which made him feel weird)   The patient demonstrates the following risk factors for suicide: Chronic risk factors for suicide include:psychiatric disorder ofdepressionand completed suicide in a family member. Acute risk factorsfor suicide include: unemployment, loss (financial, interpersonal, professional) and recent discharge from inpatient psychiatry. Protective factorsfor this patient include: positive social support, responsibility to others (children, family), coping skills and hope for the future. Considering these factors, the overall suicide risk at this point appears to below. Patientisappropriate for outpatient follow up. Guns were removed by his wife.    Norman Clay, MD 02/10/2020, 9:03 AM

## 2020-02-10 ENCOUNTER — Encounter (HOSPITAL_COMMUNITY): Payer: Self-pay | Admitting: Psychiatry

## 2020-02-10 ENCOUNTER — Other Ambulatory Visit: Payer: Self-pay

## 2020-02-10 ENCOUNTER — Telehealth (INDEPENDENT_AMBULATORY_CARE_PROVIDER_SITE_OTHER): Payer: Medicare Other | Admitting: Psychiatry

## 2020-02-10 DIAGNOSIS — F330 Major depressive disorder, recurrent, mild: Secondary | ICD-10-CM | POA: Diagnosis not present

## 2020-02-10 DIAGNOSIS — F4310 Post-traumatic stress disorder, unspecified: Secondary | ICD-10-CM | POA: Diagnosis not present

## 2020-02-10 MED ORDER — DULOXETINE HCL 60 MG PO CPEP: 60.0000 mg | ORAL_CAPSULE | Freq: Every day | ORAL | 0 refills | Status: DC

## 2020-02-10 NOTE — Addendum Note (Signed)
Addended by: [name] on: 02/10/2020 09:04 AM   Modules accepted: Level of Service

## 2020-02-10 NOTE — Patient Instructions (Signed)
1.Continueduloxetine 60mg  daily  2. Continue valium2.5 mg - 5 mg daily as needed for anxiety  3.Next appointment:9/3 at 9:10

## 2020-03-11 ENCOUNTER — Other Ambulatory Visit (HOSPITAL_COMMUNITY): Payer: Self-pay | Admitting: Psychiatry

## 2020-03-11 MED ORDER — DIAZEPAM 5 MG PO TABS: ORAL_TABLET | ORAL | 1 refills | Status: DC

## 2020-03-11 NOTE — Telephone Encounter (Signed)
I have utilized the [place] Controlled Substances Reporting System (PMP AWARxE) to confirm adherence regarding the patient's medication. My review reveals appropriate prescription fills.  
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
## 2020-05-08 ENCOUNTER — Encounter (HOSPITAL_COMMUNITY): Payer: Self-pay | Admitting: Psychiatry

## 2020-05-08 ENCOUNTER — Other Ambulatory Visit: Payer: Self-pay

## 2020-05-08 ENCOUNTER — Telehealth (INDEPENDENT_AMBULATORY_CARE_PROVIDER_SITE_OTHER): Payer: Medicare Other | Admitting: Psychiatry

## 2020-05-08 DIAGNOSIS — F330 Major depressive disorder, recurrent, mild: Secondary | ICD-10-CM

## 2020-05-08 DIAGNOSIS — F411 Generalized anxiety disorder: Secondary | ICD-10-CM

## 2020-05-08 DIAGNOSIS — F4310 Post-traumatic stress disorder, unspecified: Secondary | ICD-10-CM

## 2020-05-08 MED ORDER — DIAZEPAM 5 MG PO TABS: ORAL_TABLET | ORAL | 1 refills | Status: DC

## 2020-05-08 MED ORDER — DULOXETINE HCL 60 MG PO CPEP: 60.0000 mg | ORAL_CAPSULE | Freq: Every day | ORAL | 0 refills | Status: DC

## 2020-05-08 NOTE — Patient Instructions (Signed)
1.Continueduloxetine 60mg  daily  2. Continue valium2.5 mg - 5 mg daily as needed for anxiety 3.Next appointment:10/29 at 11 AM
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
## 2020-07-03 ENCOUNTER — Telehealth (INDEPENDENT_AMBULATORY_CARE_PROVIDER_SITE_OTHER): Payer: Medicare Other | Admitting: Psychiatry

## 2020-07-03 ENCOUNTER — Other Ambulatory Visit: Payer: Self-pay

## 2020-07-03 ENCOUNTER — Encounter (HOSPITAL_COMMUNITY): Payer: Self-pay | Admitting: Psychiatry

## 2020-07-03 DIAGNOSIS — F411 Generalized anxiety disorder: Secondary | ICD-10-CM | POA: Diagnosis not present

## 2020-07-03 DIAGNOSIS — F4310 Post-traumatic stress disorder, unspecified: Secondary | ICD-10-CM

## 2020-07-03 DIAGNOSIS — F3341 Major depressive disorder, recurrent, in partial remission: Secondary | ICD-10-CM

## 2020-07-03 MED ORDER — DULOXETINE HCL 60 MG PO CPEP: 60.0000 mg | ORAL_CAPSULE | Freq: Every day | ORAL | 0 refills | Status: DC

## 2020-07-03 NOTE — Patient Instructions (Signed)
1.Continueduloxetine 60mg  daily  2. Continue valium2.5 mg - 5 mg daily as needed for anxiety 3.Next appointment: 1/28 at 11 AM

## 2020-09-18 ENCOUNTER — Telehealth: Payer: Self-pay

## 2020-09-18 ENCOUNTER — Other Ambulatory Visit: Payer: Self-pay | Admitting: Psychiatry

## 2020-09-18 MED ORDER — DIAZEPAM 5 MG PO TABS: ORAL_TABLET | ORAL | 0 refills | Status: DC

## 2020-09-18 NOTE — Telephone Encounter (Signed)
Ordered.   I have utilized the [place] Controlled Substances Reporting System (PMP AWARxE) to confirm adherence regarding the patient's medication. My review reveals appropriate prescription fills.

## 2020-09-18 NOTE — Telephone Encounter (Signed)
Medication refill - Fax refill requested from patient's [facility] for a new Diazepam prescription, last written 05/25/20 + 1 refill and patient's next appointment 10/05/20.

## 2020-09-30 NOTE — Progress Notes (Signed)
Virtual Visit via Video Note  I connected with Dakota Snow on 10/05/20 at 11:00 AM EST by a video enabled telemedicine application and verified that I am speaking with the correct person using two identifiers.  Location: Patient: home Provider: office Persons participated in the visit- patient, provider   I discussed the limitations of evaluation and management by telemedicine and the availability of in person appointments. The patient expressed understanding and agreed to proceed.    I discussed the assessment and treatment plan with the patient. The patient was provided an opportunity to ask questions and all were answered. The patient agreed with the plan and demonstrated an understanding of the instructions.   The patient was advised to call back or seek an in-person evaluation if the symptoms worsen or if the condition fails to improve as anticipated.  I provided 20 minutes of non-face-to-face time during this encounter.   [name], MD    [facility] MD/PA/NP OP Progress Note  10/05/2020 11:32 AM Dakota Snow  MRN:  [id number]  Chief Complaint:  Chief Complaint    Trauma; Follow-up     HPI:  This is a follow-up appointment for PTSD, depression and anxiety.  He states that holidays was "not great, but it was not bad."  His son died in 08-10-23, and he expected that he would be emotional, although it was much better compared to before.  He was having flashback almost every day during the holiday season.  He also reports nervousness, which she refers to him not working due to holidays and weather condition.  He liked to contribute through work, although he has been doing physically better when he does not work.  He agrees that he wants to balance with his pain/work, and is considering to find a one which would work for him better.  He has been talking with his daughter on the phone.  He feels good that she is doing better, although it is an adjustment for him.  He states that  the relationship with his wife is trending in a better direction.  He has insomnia, which she attributes to anxiety.  He feels down at times, although it has been getting better.  He has fair energy and motivation. He has fair concentration.  He had passive SI during holiday season, although he denies any recently.  He feels more anxious and tense lately. He denies nightmares.  He has not taken Valium for the past few weeks.  He denies alcohol use for the past month.  He denies drug use.   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 He left since he had neck surgery. He was in Patent examiner for 15 years.  Household:wife, his daughter Marital status:married Number of children:2 (his son deceased in 2016/12/05by suicide)   Visit Diagnosis:    ICD-10-CM   1. PTSD (post-traumatic stress disorder)  F43.10   2. MDD (major depressive disorder), recurrent episode, mild (HCC)  F33.0   3. Anxiety state  F41.1     Past Psychiatric History: Please see initial evaluation for full details. I have reviewed the history. No updates at this time.     Past Medical History:  Past Medical History:  Diagnosis Date  . Anxiety   . Arthritis   . Depression    watched son committ suicide  . GERD (gastroesophageal reflux disease)   . Hypertension    no meds  . PONV (postoperative nausea and vomiting)  HAD SOME ISSUES WITH NAUSEA.- THINKS ITS MEDICATION RELATED, NOT ANESTHESIA    Past Surgical History:  Procedure Laterality Date  . APPLICATION OF ROBOTIC ASSISTANCE FOR SPINAL PROCEDURE N/A 02/19/2018   Procedure: APPLICATION OF ROBOTIC ASSISTANCE FOR SPINAL PROCEDURE;  Surgeon: Barnett Abu, MD;  Location: [facility] OR;  Service: Neurosurgery;  Laterality: N/A;  . BACK SURGERY     X 2   . HAND SURGERY    . NECK SURGERY      Family Psychiatric History: Please see initial evaluation for full details. I have reviewed the history. No updates at this time.      Family History:  Family History  Problem Relation Age of Onset  . Anxiety disorder Mother   . Depression Father     Social History:  Social History   Socioeconomic History  . Marital status: Married    Spouse name: Not on file  . Number of children: Not on file  . Years of education: Not on file  . Highest education level: Not on file  Occupational History  . Not on file  Tobacco Use  . Smoking status: Former Smoker    Years: 1.00    Quit date: 07/09/1988    Years since quitting: 32.2  . Smokeless tobacco: Former Neurosurgeon    Types: Chew    Quit date: 11/20/2017  Vaping Use  . Vaping Use: Never used  Substance and Sexual Activity  . Alcohol use: Not Currently    Comment: occ  . Drug use: Not on file    Comment: CBD OINTMENT ON BACK  . Sexual activity: Yes    Birth control/protection: Implant  Other Topics Concern  . Not on file  Social History Narrative  . Not on file   Social Determinants of Health   Financial Resource Strain: Not on file  Food Insecurity: Not on file  Transportation Needs: Not on file  Physical Activity: Not on file  Stress: Not on file  Social Connections: Not on file    Allergies: No Known Allergies  Metabolic Disorder Labs: No results found for: HGBA1C, MPG No results found for: PROLACTIN No results found for: CHOL, TRIG, HDL, CHOLHDL, VLDL, LDLCALC No results found for: TSH  Therapeutic Level Labs: No results found for: LITHIUM No results found for: VALPROATE No components found for:  CBMZ  Current Medications: Current Outpatient Medications  Medication Sig Dispense Refill  . acetaminophen (TYLENOL) 500 MG tablet Take 500 mg by mouth daily as needed for moderate pain.    . Ca Carbonate-Mag Hydroxide (ROLAIDS ANTACID ULTRA STRENGTH PO) Take 1-2 tablets by mouth at bedtime as needed (indigestion/heartburn).     . diazepam (VALIUM) 5 MG tablet TAKE 1/2 TO 1 TABLET BY MOUTH DAILY AS NEEDED FOR ANXIETY 30 tablet 0  . DULoxetine  (CYMBALTA) 60 MG capsule Take 1 capsule (60 mg total) by mouth daily. 90 capsule 0  . fluticasone (FLONASE) 50 MCG/ACT nasal spray Place 1 spray into both nostrils daily.    Dakota Snow lisinopril-hydrochlorothiazide (PRINZIDE,ZESTORETIC) 10-12.5 MG tablet Take 1 tablet by mouth every evening.     No current facility-administered medications for this visit.     Musculoskeletal: Strength & Muscle Tone: N/A Gait & Station: N/A Patient leans: N/A  Psychiatric Specialty Exam: Review of Systems  Psychiatric/Behavioral: Positive for dysphoric mood and sleep disturbance. Negative for agitation, behavioral problems, confusion, decreased concentration, hallucinations, self-injury and suicidal ideas. The patient is nervous/anxious. The patient is not hyperactive.   All other systems reviewed and  are negative.   There were no vitals taken for this visit.There is no height or weight on file to calculate BMI.  General Appearance: Fairly Groomed  Eye Contact:  Good  Speech:  Clear and Coherent  Volume:  Normal  Mood:  better now  Affect:  Appropriate, Congruent, Tearful and down at times  Thought Process:  Coherent  Orientation:  Full (Time, Place, and Person)  Thought Content: Logical   Suicidal Thoughts:  No  Homicidal Thoughts:  No  Memory:  Immediate;   Good  Judgement:  Good  Insight:  Good  Psychomotor Activity:  Normal  Concentration:  Concentration: Good and Attention Span: Good  Recall:  Good  Fund of Knowledge: Good  Language: Good  Akathisia:  No  Handed:  Right  AIMS (if indicated): not done  Assets:  Communication Skills Desire for Improvement  ADL's:  Intact  Cognition: WNL  Sleep:  Poor   Screenings: Secondary school teacher Row Counselor from 09/04/2018 in [facility]  PHQ-2 Total Score 4  PHQ-9 Total Score 21       Assessment and Plan:  CYNCERE SONTAG is a 55 y.o. year old male with a history of Depression, PTSD,  spondylolisthesisL4-L5, lumbar radiculopathy, who presents for follow up appointment for below.   1. PTSD (post-traumatic stress disorder) 2. MDD (major depressive disorder), recurrent episode, mild (HCC) 3. Anxiety state There has been slight worsening in his mood symptoms in the context of holiday season/grief of loss of his son by suicide in November 2016.  Other psychosocial stressors includes back/neck pain and missing his daughter who had moved to [place].  Although he may benefit from up titration of duloxetine, he would like to continue the current dose at this time.  Will continue duloxetine to target PTSD, depression, anxiety and the pain.  Will continue Valium as needed for anxiety.  He is aware of its risk of dependence and oversedation.   # Alcohol use No recent significant use of alcohol.He has a history of binge drinking which he reportedas coping skills.We will continue to monitor.  Plan I have reviewed and updated plans as below 1.Continueduloxetine 60mg  daily  2. Continue valium2.5 mg - 5 mg daily as needed for anxiety (he declined refill)  3.Next appointment:  3/28 at 11 AM for 4/28, video - he would like to hold therapy until he is able to see in person  Past trials of medication:duloxetine, valium (may have tried sertraline in the past, which made him feel weird)   The patient demonstrates the following risk factors for suicide: Chronic risk factors for suicide include:psychiatric disorder ofdepressionand completed suicide in a family member. Acute risk factorsfor suicide include: unemployment, loss (financial, interpersonal, professional) and recent discharge from inpatient psychiatry. Protective factorsfor this patient include: positive social support, responsibility to others (children, family), coping skills and hope for the future. Considering these factors, the overall suicide risk at this point appears to below. Patientisappropriate for  outpatient follow up. Guns were removed by his wife.  , MD 10/05/2020, 11:32 AM

## 2020-10-05 ENCOUNTER — Telehealth (INDEPENDENT_AMBULATORY_CARE_PROVIDER_SITE_OTHER): Payer: Medicare PPO | Admitting: Psychiatry

## 2020-10-05 ENCOUNTER — Encounter: Payer: Self-pay | Admitting: Psychiatry

## 2020-10-05 ENCOUNTER — Other Ambulatory Visit: Payer: Self-pay

## 2020-10-05 DIAGNOSIS — F411 Generalized anxiety disorder: Secondary | ICD-10-CM | POA: Diagnosis not present

## 2020-10-05 DIAGNOSIS — F330 Major depressive disorder, recurrent, mild: Secondary | ICD-10-CM

## 2020-10-05 DIAGNOSIS — F4310 Post-traumatic stress disorder, unspecified: Secondary | ICD-10-CM

## 2020-10-05 MED ORDER — DULOXETINE HCL 60 MG PO CPEP: 60.0000 mg | ORAL_CAPSULE | Freq: Every day | ORAL | 0 refills | Status: DC

## 2020-11-23 NOTE — Progress Notes (Signed)
Virtual Visit via Video Note  I connected with Dakota Snow on 11/30/20 at 11:00 AM EDT by a video enabled telemedicine application and verified that I am speaking with the correct person using two identifiers.  Location: Patient: home Provider: office Persons participated in the visit- patient, provider   I discussed the limitations of evaluation and management by telemedicine and the availability of in person appointments. The patient expressed understanding and agreed to proceed.    I discussed the assessment and treatment plan with the patient. The patient was provided an opportunity to ask questions and all were answered. The patient agreed with the plan and demonstrated an understanding of the instructions.   The patient was advised to call back or seek an in-person evaluation if the symptoms worsen or if the condition fails to improve as anticipated.  I provided 20 minutes of non-face-to-face time during this encounter.   Dakota Hotter, MD    [facility] MD/PA/NP OP Progress Note  11/30/2020 11:39 AM Dakota Snow  MRN:  [id number]  Chief Complaint:  Chief Complaint    Trauma; Depression; Follow-up     HPI:  This is a follow-up appointment for depression and PTSD.  He states that he was doing well until last weekend.  He applied for a temporary job.  He had overloads of anxiety, being in the crowds. He had a few encounters with the customer, who claimed that they oversold the event.  He also had a supervisor, who was rude to the patient.  He responded to the supervisor that they oversewed the event, and it was not his problem when he was asked to squeeze people in the space.  He felt that he is not the person he was anymore.  He thinks he used to be able to handle things better.  He talks with his daughter, who struggles with the feelings.  It also made him think that he let his son down.  He agrees that he has difficult time being kind to himself, stating that he tends to  downgrade himself. He does not have confidence in himself.  Although he was ambivalent about up titration of duloxetine due to concern of difficulty in coming off the medication, he agrees to pursue this after being provided psychoeducation.   He has been depressed over the past few days, although it has been improving.  He had decrease in appetite, and insomnia for the past few days.  Although he reports fleeting passive SI, he denies any plan or intent, and he thinks about his daughter, who would have been impacted if he were not to be there.  He has not drink alcohol since the last visit.  He has not taken Valium.    Visit Diagnosis:    ICD-10-CM   1. PTSD (post-traumatic stress disorder)  F43.10   2. MDD (major depressive disorder), recurrent episode, moderate (HCC)  F33.1     Past Psychiatric History: Please see initial evaluation for full details. I have reviewed the history. No updates at this time.     Past Medical History:  Past Medical History:  Diagnosis Date  . Anxiety   . Arthritis   . Depression    watched son committ suicide  . GERD (gastroesophageal reflux disease)   . Hypertension    no meds  . PONV (postoperative nausea and vomiting)    HAD SOME ISSUES WITH NAUSEA.- THINKS ITS MEDICATION RELATED, NOT ANESTHESIA    Past Surgical History:  Procedure Laterality Date  .  APPLICATION OF ROBOTIC ASSISTANCE FOR SPINAL PROCEDURE N/A 02/19/2018   Procedure: APPLICATION OF ROBOTIC ASSISTANCE FOR SPINAL PROCEDURE;  Surgeon: Barnett Abu, MD;  Location: [facility] OR;  Service: Neurosurgery;  Laterality: N/A;  . BACK SURGERY     X 2   . HAND SURGERY    . NECK SURGERY      Family Psychiatric History: Please see initial evaluation for full details. I have reviewed the history. No updates at this time.     Family History:  Family History  Problem Relation Age of Onset  . Anxiety disorder Mother   . Depression Father     Social History:  Social History   Socioeconomic  History  . Marital status: Married    Spouse name: Not on file  . Number of children: Not on file  . Years of education: Not on file  . Highest education level: Not on file  Occupational History  . Not on file  Tobacco Use  . Smoking status: Former Smoker    Years: 1.00    Quit date: 07/09/1988    Years since quitting: 32.4  . Smokeless tobacco: Former Neurosurgeon    Types: Chew    Quit date: 11/20/2017  Vaping Use  . Vaping Use: Never used  Substance and Sexual Activity  . Alcohol use: Not Currently    Comment: occ  . Drug use: Not on file    Comment: CBD OINTMENT ON BACK  . Sexual activity: Yes    Birth control/protection: Implant  Other Topics Concern  . Not on file  Social History Narrative  . Not on file   Social Determinants of Health   Financial Resource Strain: Not on file  Food Insecurity: Not on file  Transportation Needs: Not on file  Physical Activity: Not on file  Stress: Not on file  Social Connections: Not on file    Allergies: No Known Allergies  Metabolic Disorder Labs: No results found for: HGBA1C, MPG No results found for: PROLACTIN No results found for: CHOL, TRIG, HDL, CHOLHDL, VLDL, LDLCALC No results found for: TSH  Therapeutic Level Labs: No results found for: LITHIUM No results found for: VALPROATE No components found for:  CBMZ  Current Medications: Current Outpatient Medications  Medication Sig Dispense Refill  . [START ON 12/01/2020] DULoxetine (CYMBALTA) 30 MG capsule Take 3 capsules (90 mg total) by mouth daily. 90 capsule 0  . acetaminophen (TYLENOL) 500 MG tablet Take 500 mg by mouth daily as needed for moderate pain.    . Ca Carbonate-Mag Hydroxide (ROLAIDS ANTACID ULTRA STRENGTH PO) Take 1-2 tablets by mouth at bedtime as needed (indigestion/heartburn).     . diazepam (VALIUM) 5 MG tablet TAKE 1/2 TO 1 TABLET BY MOUTH DAILY AS NEEDED FOR ANXIETY 30 tablet 0  . DULoxetine (CYMBALTA) 60 MG capsule Take 1 capsule (60 mg total) by mouth  daily. 90 capsule 0  . fluticasone (FLONASE) 50 MCG/ACT nasal spray Place 1 spray into both nostrils daily.    Marland Kitchen lisinopril-hydrochlorothiazide (PRINZIDE,ZESTORETIC) 10-12.5 MG tablet Take 1 tablet by mouth every evening.     No current facility-administered medications for this visit.     Musculoskeletal: Strength & Muscle Tone: N/A Gait & Station: N/A Patient leans: N/A  Psychiatric Specialty Exam: Review of Systems  Psychiatric/Behavioral: Positive for dysphoric mood and sleep disturbance. Negative for agitation, behavioral problems, confusion, decreased concentration, hallucinations, self-injury and suicidal ideas. The patient is nervous/anxious. The patient is not hyperactive.   All other systems reviewed and are  negative.   There were no vitals taken for this visit.There is no height or weight on file to calculate BMI.  General Appearance: Fairly Groomed  Eye Contact:  Good  Speech:  Clear and Coherent  Volume:  Normal  Mood:  Depressed  Affect:  Appropriate, Congruent and Tearful  Thought Process:  Coherent  Orientation:  Full (Time, Place, and Person)  Thought Content: Logical   Suicidal Thoughts:  Yes.  without intent/plan  Homicidal Thoughts:  No  Memory:  Immediate;   Good  Judgement:  Good  Insight:  Good  Psychomotor Activity:  Normal  Concentration:  Concentration: Good and Attention Span: Good  Recall:  Good  Fund of Knowledge: Good  Language: Good  Akathisia:  No  Handed:  Right  AIMS (if indicated): not done  Assets:  Communication Skills Desire for Improvement  ADL's:  Intact  Cognition: WNL  Sleep:  Poor   Screenings: Secondary school teacher Row Counselor from 09/04/2018 in [facility]  PHQ-2 Total Score 4  PHQ-9 Total Score 21       Assessment and Plan:  Dakota Snow is a 55 y.o. year old male with a history of Depression, PTSD, spondylolisthesisL4-L5, lumbar radiculopathy, who presents for follow  up appointment for below.   1. PTSD (post-traumatic stress disorder) 2. MDD (major depressive disorder), recurrent episode, moderate (HCC) He reports worsening in depressive symptoms especially over the past weekend in the context of incident work.  Psychosocial stressors includes complicated grief of loss of his son by suicide in November 2016. Other psychosocial stressors includes back/neck pain and missing his daughter who had moved to [place].  We uptitrate duloxetine to optimize treatment for PTSD and depression, anxiety.  Will continue Valium as needed for anxiety.  He is aware of its risk of dependence and oversedation.   # Alcohol use He denies any alcohol use since the last visit. He has a history of binge drinking which he reportedas coping skills.We will continue to monitor.  Plan  1.Increase duloxetine 90mg  daily  2. Continue valium2.5 mg - 5 mg daily as needed for anxiety (he declined refill)  3.Next appointment:4/28 at 3:31for 23f, video - he would like to hold therapy until he is able to see in person  Past trials of medication:duloxetine, valium (may have tried sertraline in the past, which made him feel weird)  I have reviewed suicide assessment in detail. No change in the following assessment.   The patient demonstrates the following risk factors for suicide: Chronic risk factors for suicide include:psychiatric disorder ofdepressionand completed suicide in a family member. Acute risk factorsfor suicide include: unemployment, loss (financial, interpersonal, professional) and recent discharge from inpatient psychiatry. Protective factorsfor this patient include: positive social support, responsibility to others (children, family), coping skills and hope for the future. Considering these factors, the overall suicide risk at this point appears to below. Patientisappropriate for outpatient follow up. Guns were removed by his wife.   ,  MD 11/30/2020, 11:39 AM

## 2020-11-30 ENCOUNTER — Other Ambulatory Visit: Payer: Self-pay

## 2020-11-30 ENCOUNTER — Telehealth (INDEPENDENT_AMBULATORY_CARE_PROVIDER_SITE_OTHER): Payer: Medicare PPO | Admitting: Psychiatry

## 2020-11-30 ENCOUNTER — Encounter: Payer: Self-pay | Admitting: Psychiatry

## 2020-11-30 DIAGNOSIS — F4310 Post-traumatic stress disorder, unspecified: Secondary | ICD-10-CM

## 2020-11-30 DIAGNOSIS — F331 Major depressive disorder, recurrent, moderate: Secondary | ICD-10-CM

## 2020-11-30 MED ORDER — DULOXETINE HCL 30 MG PO CPEP: 90.0000 mg | ORAL_CAPSULE | Freq: Every day | ORAL | 0 refills | Status: DC

## 2020-11-30 NOTE — Patient Instructions (Signed)
1.Increase duloxetine 90mg  daily  2. Continue valium2.5 mg - 5 mg daily as needed for anxiety 3.Next appointment:4/28 at 3:30

## 2020-12-24 NOTE — Progress Notes (Signed)
Virtual Visit via Video Note  I connected with Dakota Snow on 12/31/20 at  3:30 PM EDT by a video enabled telemedicine application and verified that I am speaking with the correct person using two identifiers.  Location: Patient: home Provider: office Persons participated in the visit- patient, provider   I discussed the limitations of evaluation and management by telemedicine and the availability of in person appointments. The patient expressed understanding and agreed to proceed.    I discussed the assessment and treatment plan with the patient. The patient was provided an opportunity to ask questions and all were answered. The patient agreed with the plan and demonstrated an understanding of the instructions.   The patient was advised to call back or seek an in-person evaluation if the symptoms worsen or if the condition fails to improve as anticipated.  I provided 18 minutes of non-face-to-face time during this encounter.   [name], MD    [facility] MD/PA/NP OP Progress Note  12/31/2020 4:01 PM Dakota Snow  MRN:  [id number]  Chief Complaint:  Chief Complaint    Follow-up; Trauma; Depression     HPI:  This is a follow-up appointment for depression and PTSD.  He states that he was working 20 hours a week.  It was constant stress, and he felt unstable.  His family also recommended him to quit the work due to the stress.  He quit a few days ago.  Although he was disappointed in himself, he feels relieved as well.  He agrees that it was more difficult for him to quit rather than staying.  He is trying to find a job, although it will be difficult because he does not have skills.  He has been getting back to routine of taking a walk.  He drank three quarters of whiskey when he was stressed.  He has occasional craving for alcohol.  After provided psychoeducation, he thinks he will be more mindful about his drinking pattern.  He sleeps fair.  He lost 8 pounds, which he attributes to  keto diet.  He denies SI.  He denies nightmares or flashback.  He has been able to feel relaxed since he quit the job.  He has not taken Valium for the past few months.   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 He left since he had neck surgery. He was in Patent examiner for 15 years.  Household:wife, his daughter Marital status:married Number of children:2 (his son deceased in 12/16/2016by suicide)  184 lbs Wt Readings from Last 3 Encounters:  11/12/18 192 lb (87.1 kg)  08/13/18 196 lb (88.9 kg)  06/11/18 197 lb (89.4 kg)     Visit Diagnosis:    ICD-10-CM   1. PTSD (post-traumatic stress disorder)  F43.10   2. MDD (major depressive disorder), recurrent episode, mild (HCC)  F33.0     Past Psychiatric History: Please see initial evaluation for full details. I have reviewed the history. No updates at this time.     Past Medical History:  Past Medical History:  Diagnosis Date  . Anxiety   . Arthritis   . Depression    watched son committ suicide  . GERD (gastroesophageal reflux disease)   . Hypertension    no meds  . PONV (postoperative nausea and vomiting)    HAD SOME ISSUES WITH NAUSEA.- THINKS ITS MEDICATION RELATED, NOT ANESTHESIA    Past Surgical History:  Procedure Laterality Date  . APPLICATION OF ROBOTIC ASSISTANCE FOR SPINAL  PROCEDURE N/A 02/19/2018   Procedure: APPLICATION OF ROBOTIC ASSISTANCE FOR SPINAL PROCEDURE;  Surgeon: Barnett Abu, MD;  Location: [facility] OR;  Service: Neurosurgery;  Laterality: N/A;  . BACK SURGERY     X 2   . HAND SURGERY    . NECK SURGERY      Family Psychiatric History: Please see initial evaluation for full details. I have reviewed the history. No updates at this time.     Family History:  Family History  Problem Relation Age of Onset  . Anxiety disorder Mother   . Depression Father     Social History:  Social History   Socioeconomic History  . Marital status: Married    Spouse  name: Not on file  . Number of children: Not on file  . Years of education: Not on file  . Highest education level: Not on file  Occupational History  . Not on file  Tobacco Use  . Smoking status: Former Smoker    Years: 1.00    Quit date: 07/09/1988    Years since quitting: 32.5  . Smokeless tobacco: Former Neurosurgeon    Types: Chew    Quit date: 11/20/2017  Vaping Use  . Vaping Use: Never used  Substance and Sexual Activity  . Alcohol use: Not Currently    Comment: occ  . Drug use: Not on file    Comment: CBD OINTMENT ON BACK  . Sexual activity: Yes    Birth control/protection: Implant  Other Topics Concern  . Not on file  Social History Narrative  . Not on file   Social Determinants of Health   Financial Resource Strain: Not on file  Food Insecurity: Not on file  Transportation Needs: Not on file  Physical Activity: Not on file  Stress: Not on file  Social Connections: Not on file    Allergies: No Known Allergies  Metabolic Disorder Labs: No results found for: HGBA1C, MPG No results found for: PROLACTIN No results found for: CHOL, TRIG, HDL, CHOLHDL, VLDL, LDLCALC No results found for: TSH  Therapeutic Level Labs: No results found for: LITHIUM No results found for: VALPROATE No components found for:  CBMZ  Current Medications: Current Outpatient Medications  Medication Sig Dispense Refill  . acetaminophen (TYLENOL) 500 MG tablet Take 500 mg by mouth daily as needed for moderate pain.    . Ca Carbonate-Mag Hydroxide (ROLAIDS ANTACID ULTRA STRENGTH PO) Take 1-2 tablets by mouth at bedtime as needed (indigestion/heartburn).     . diazepam (VALIUM) 5 MG tablet TAKE 1/2 TO 1 TABLET BY MOUTH DAILY AS NEEDED FOR ANXIETY 30 tablet 0  . DULoxetine (CYMBALTA) 30 MG capsule Take 3 capsules (90 mg total) by mouth daily. 270 capsule 0  . DULoxetine (CYMBALTA) 60 MG capsule Take 1 capsule (60 mg total) by mouth daily. 90 capsule 0  . fluticasone (FLONASE) 50 MCG/ACT nasal  spray Place 1 spray into both nostrils daily.    Marland Kitchen lisinopril-hydrochlorothiazide (PRINZIDE,ZESTORETIC) 10-12.5 MG tablet Take 1 tablet by mouth every evening.     No current facility-administered medications for this visit.     Musculoskeletal: Strength & Muscle Tone: N/A Gait & Station: N/A Patient leans: N/A  Psychiatric Specialty Exam: Review of Systems  Psychiatric/Behavioral: Positive for dysphoric mood. Negative for agitation, behavioral problems, confusion, decreased concentration, hallucinations, self-injury, sleep disturbance and suicidal ideas. The patient is not nervous/anxious and is not hyperactive.   All other systems reviewed and are negative.   There were no vitals taken for this  visit.There is no height or weight on file to calculate BMI.  General Appearance: Fairly Groomed  Eye Contact:  Good  Speech:  Clear and Coherent  Volume:  Normal  Mood:  better now  Affect:  Appropriate, Congruent and euthymic  Thought Process:  Coherent  Orientation:  Full (Time, Place, and Person)  Thought Content: Logical   Suicidal Thoughts:  No  Homicidal Thoughts:  No  Memory:  Immediate;   Good  Judgement:  Good  Insight:  Good  Psychomotor Activity:  Normal  Concentration:  Concentration: Good and Attention Span: Good  Recall:  Good  Fund of Knowledge: Good  Language: Good  Akathisia:  No  Handed:  Right  AIMS (if indicated): not done  Assets:  Communication Skills Desire for Improvement  ADL's:  Intact  Cognition: WNL  Sleep:  Fair   Screenings: Administrator, sports from 09/04/2018 in [facility]  PHQ-2 Total Score 4  PHQ-9 Total Score 21       Assessment and Plan:  TADEN WITTER is a 55 y.o. year old male with a history of Depression, PTSD, spondylolisthesisL4-L5, lumbar radiculopathy, , who presents for follow up appointment for below.   1. PTSD (post-traumatic stress disorder) 2. MDD (major  depressive disorder), recurrent episode, mild (HCC) Although he had depressive symptoms, it has been improving since he left the work few days ago.  Other psychosocial stressors includes complicated grief of loss of his son by suicide in November 2016, back/neck pain.  Will continue current dose of duloxetine to target PTSD and depression.  Will continue Valium as needed for anxiety.   # Alcohol use He had an recent episode of binge drinking since the last visit.  He is not interested in pharmacological option at this time.  Will continue to monitor.   Plan I have reviewed and updated plans as below 1.Continue duloxetine 90mg  daily 2. Continuevalium2.5 mg - 5 mg daily as needed for anxiety (he declined refill)  3.Next appointment:6/28 at 11 AMfor , video - he would like to hold therapy until he is able to see in person  Past trials of medication:duloxetine, valium (may have tried sertraline in the past, which made him feel weird)   The patient demonstrates the following risk factors for suicide: Chronic risk factors for suicide include:psychiatric disorder ofdepressionand completed suicide in a family member. Acute risk factorsfor suicide include: unemployment, loss (financial, interpersonal, professional) and recent discharge from inpatient psychiatry. Protective factorsfor this patient include: positive social support, responsibility to others (children, family), coping skills and hope for the future. Considering these factors, the overall suicide risk at this point appears to below. Patientisappropriate for outpatient follow up. Guns were removed by his wife.    , MD 12/31/2020, 4:01 PM

## 2020-12-31 ENCOUNTER — Encounter: Payer: Self-pay | Admitting: Psychiatry

## 2020-12-31 ENCOUNTER — Telehealth (INDEPENDENT_AMBULATORY_CARE_PROVIDER_SITE_OTHER): Payer: Medicare PPO | Admitting: Psychiatry

## 2020-12-31 ENCOUNTER — Other Ambulatory Visit: Payer: Self-pay

## 2020-12-31 DIAGNOSIS — F330 Major depressive disorder, recurrent, mild: Secondary | ICD-10-CM | POA: Diagnosis not present

## 2020-12-31 DIAGNOSIS — F4310 Post-traumatic stress disorder, unspecified: Secondary | ICD-10-CM | POA: Diagnosis not present

## 2020-12-31 MED ORDER — DULOXETINE HCL 30 MG PO CPEP: 90.0000 mg | ORAL_CAPSULE | Freq: Every day | ORAL | 0 refills | Status: DC

## 2020-12-31 NOTE — Patient Instructions (Signed)
1.Continue duloxetine 90mg  daily 2. Continuevalium2.5 mg - 5 mg daily as needed for anxiety 3.Next appointment:6/28 at 11 AM

## 2021-02-25 NOTE — Progress Notes (Signed)
Virtual Visit via Video Note  I connected with Dakota Snow on 03/02/21 at 11:00 AM EDT by a video enabled telemedicine application and verified that I am speaking with the correct person using two identifiers.  Location: Patient: home Provider: office Persons participated in the visit- patient, provider    I discussed the limitations of evaluation and management by telemedicine and the availability of in person appointments. The patient expressed understanding and agreed to proceed.    I discussed the assessment and treatment plan with the patient. The patient was provided an opportunity to ask questions and all were answered. The patient agreed with the plan and demonstrated an understanding of the instructions.   The patient was advised to call back or seek an in-person evaluation if the symptoms worsen or if the condition fails to improve as anticipated.  I provided 15 minutes of non-face-to-face time during this encounter.   [name], MD    [facility] MD/PA/NP OP Progress Note  03/02/2021 11:31 AM Dakota Snow  MRN:  [id number]  Chief Complaint:  Chief Complaint   Depression; Follow-up; Trauma    HPI:  This is a follow-up appointment for PTSD and depression.  He states that he has been doing fine.  There has been more to do outside in summer.  His wife is currently on vacation as the school is closed.  He enjoys more time with his wife.  He created [facility], and it was a nice project to get done.  He has another few projects he is interested in doing.  There were a few times he felt depressed and had anhedonia.  He had movements when he was taking a walk, thinking about his son.  He also talks about his daughter, who called him at night, crying as she was missing his brother.  He states that there is always something.  However, he does not feel so low compared to before.  He drank 12% of alcohol, two of 20 oz. he agrees that he tends to drink alcohol when he felt stressed.  He  is willing to try higher dose of duloxetine at this time.  He denies SI.  He denies anxiety or panic attacks.  He has not used diazepam since the last visit.  He denies drug use.    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 Household: wife, his daughter Marital status: married Number of children: 2 (his son deceased in 11-19-16by suicide)  Visit Diagnosis:    ICD-10-CM   1. PTSD (post-traumatic stress disorder)  F43.10     2. MDD (major depressive disorder), recurrent episode, mild (HCC)  F33.0       Past Psychiatric History: Please see initial evaluation for full details. I have reviewed the history. No updates at this time.     Past Medical History:  Past Medical History:  Diagnosis Date   Anxiety    Arthritis    Depression    watched son committ suicide   GERD (gastroesophageal reflux disease)    Hypertension    no meds   PONV (postoperative nausea and vomiting)    HAD SOME ISSUES WITH NAUSEA.- THINKS ITS MEDICATION RELATED, NOT ANESTHESIA    Past Surgical History:  Procedure Laterality Date   APPLICATION OF ROBOTIC ASSISTANCE FOR SPINAL PROCEDURE N/A 02/19/2018   Procedure: APPLICATION OF ROBOTIC ASSISTANCE FOR SPINAL  PROCEDURE;  Surgeon: Barnett Abu, MD;  [facility] OR;  Service: Neurosurgery;  Laterality: N/A;   BACK SURGERY     X 2    HAND SURGERY     NECK SURGERY      Family Psychiatric History: Please see initial evaluation for full details. I have reviewed the history. No updates at this time.     Family History:  Family History  Problem Relation Age of Onset   Anxiety disorder Mother    Depression Father     Social History:  Social History   Socioeconomic History   Marital status: Married    Spouse name: Not on file   Number of children: Not on file   Years of education: Not on file   Highest education level: Not on file   Occupational History   Not on file  Tobacco Use   Smoking status: Former    Years: 1.00    Pack years: 0.00    Types: Cigarettes    Quit date: 07/09/1988    Years since quitting: 32.6   Smokeless tobacco: Former    Types: Chew    Quit date: 11/20/2017  Vaping Use   Vaping Use: Never used  Substance and Sexual Activity   Alcohol use: Not Currently    Comment: occ   Drug use: Not on file    Comment: CBD OINTMENT ON BACK   Sexual activity: Yes    Birth control/protection: Implant  Other Topics Concern   Not on file  Social History Narrative   Not on file   Social Determinants of Health   Financial Resource Strain: Not on file  Food Insecurity: Not on file  Transportation Needs: Not on file  Physical Activity: Not on file  Stress: Not on file  Social Connections: Not on file    Allergies: No Known Allergies  Metabolic Disorder Labs: No results found for: HGBA1C, MPG No results found for: PROLACTIN No results found for: CHOL, TRIG, HDL, CHOLHDL, VLDL, LDLCALC No results found for: TSH  Therapeutic Level Labs: No results found for: LITHIUM No results found for: VALPROATE No components found for:  CBMZ  Current Medications: Current Outpatient Medications  Medication Sig Dispense Refill   DULoxetine (CYMBALTA) 60 MG capsule Take 2 capsules (120 mg total) by mouth daily. 180 capsule 0   acetaminophen (TYLENOL) 500 MG tablet Take 500 mg by mouth daily as needed for moderate pain.     Ca Carbonate-Mag Hydroxide (ROLAIDS ANTACID ULTRA STRENGTH PO) Take 1-2 tablets by mouth at bedtime as needed (indigestion/heartburn).      diazepam (VALIUM) 5 MG tablet TAKE 1/2 TO 1 TABLET BY MOUTH DAILY AS NEEDED FOR ANXIETY 30 tablet 0   DULoxetine (CYMBALTA) 60 MG capsule Take 1 capsule (60 mg total) by mouth daily. 90 capsule 0   fluticasone (FLONASE) 50 MCG/ACT nasal spray Place 1 spray into both nostrils daily.     lisinopril-hydrochlorothiazide (PRINZIDE,ZESTORETIC) 10-12.5 MG  tablet Take 1 tablet by mouth every evening.     No current facility-administered medications for this visit.     Musculoskeletal: Strength & Muscle Tone:  N/A Gait & Station:  N/A Patient leans: N/A  Psychiatric Specialty Exam: Review of Systems  Psychiatric/Behavioral:  Positive for dysphoric mood. Negative for agitation, behavioral problems, confusion, decreased concentration, hallucinations, self-injury, sleep disturbance and suicidal ideas. The patient is not nervous/anxious and is not hyperactive.   All other systems reviewed and are negative.  There were no vitals taken for this visit.There  is no height or weight on file to calculate BMI.  General Appearance: Fairly Groomed  Eye Contact:  Good  Speech:  Clear and Coherent  Volume:  Normal  Mood:   fine  Affect:  Appropriate, Congruent, and down at times  Thought Process:  Coherent  Orientation:  Full (Time, Place, and Person)  Thought Content: Logical   Suicidal Thoughts:  No  Homicidal Thoughts:  No  Memory:  Immediate;   Good  Judgement:  Good  Insight:  Good  Psychomotor Activity:  Normal  Concentration:  Concentration: Good and Attention Span: Good  Recall:  Good  Fund of Knowledge: Good  Language: Good  Akathisia:  No  Handed:  Right  AIMS (if indicated): not done  Assets:  Communication Skills Desire for Improvement  ADL's:  Intact  Cognition: WNL  Sleep:  Fair   Screenings: Insurance account manager from 09/04/2018 in [facility]  PHQ-2 Total Score 4  PHQ-9 Total Score 21        Assessment and Plan:  Dakota Snow is a 55 y.o. year old male with a history of Depression, PTSD, spondylolisthesis  L4-L5, lumbar radiculopathy, who presents for follow up appointment for below.   1. PTSD (post-traumatic stress disorder) 2. MDD (major depressive disorder), recurrent episode, mild (HCC) He continues to report depressive symptoms in the context of  grief of loss of his son by suicide in November 2016.  Other psychosocial stressors includes back pain.  Will do up titration of duloxetine to optimize treatment for depression and PTSD.  Will consider adjunctive treatment if he has limited benefit from this up titration.  Will continue Valium as needed for anxiety.    # Alcohol use  He tends to have occasional alcohol use as coping skills. He is not interested in pharmacological option at this time.  Will continue to monitor.    Plan  I have reviewed and updated plans as below  1. Continue duloxetine 90 mg daily  2. Continue valium  2.5 mg - 5 mg daily as needed for anxiety  (he declined refill) 3. Next appointment: 8/10 at 9:30 for 30 mins, video - he would like to hold therapy until he is able to see in person   Past trials of medication: duloxetine, valium (may have tried sertraline in the past, which made him feel weird)     The patient demonstrates the following risk factors for suicide: Chronic risk factors for suicide include: psychiatric disorder of depression and completed suicide in a family member. Acute risk factors for suicide include: unemployment, loss (financial, interpersonal, professional) and recent discharge from inpatient psychiatry. Protective factors for this patient include: positive social support, responsibility to others (children, family), coping skills and hope for the future. Considering these factors, the overall suicide risk at this point appears to be low. Patient is appropriate for outpatient follow up. Guns were removed by his wife.          [name], MD 03/02/2021, 11:31 AM

## 2021-03-02 ENCOUNTER — Encounter: Payer: Self-pay | Admitting: Psychiatry

## 2021-03-02 ENCOUNTER — Other Ambulatory Visit: Payer: Self-pay

## 2021-03-02 ENCOUNTER — Telehealth (INDEPENDENT_AMBULATORY_CARE_PROVIDER_SITE_OTHER): Payer: Medicare PPO | Admitting: Psychiatry

## 2021-03-02 DIAGNOSIS — F330 Major depressive disorder, recurrent, mild: Secondary | ICD-10-CM | POA: Diagnosis not present

## 2021-03-02 DIAGNOSIS — F4310 Post-traumatic stress disorder, unspecified: Secondary | ICD-10-CM

## 2021-03-02 MED ORDER — DULOXETINE HCL 60 MG PO CPEP: 120.0000 mg | ORAL_CAPSULE | Freq: Every day | ORAL | 0 refills | Status: DC

## 2021-03-02 NOTE — Patient Instructions (Signed)
1. Continue duloxetine 90 mg daily  2. Continue valium  2.5 mg - 5 mg daily as needed for anxiety   3. Next appointment: 8/10 at 9:30

## 2021-04-07 NOTE — Progress Notes (Signed)
Virtual Visit via Video Note  I connected with Dakota Snow on 04/14/21 at  9:30 AM EDT by a video enabled telemedicine application and verified that I am speaking with the correct person using two identifiers.  Location: Patient: home Provider: office Persons participated in the visit- patient, provider    I discussed the limitations of evaluation and management by telemedicine and the availability of in person appointments. The patient expressed understanding and agreed to proceed.   I discussed the assessment and treatment plan with the patient. The patient was provided an opportunity to ask questions and all were answered. The patient agreed with the plan and demonstrated an understanding of the instructions.   The patient was advised to call back or seek an in-person evaluation if the symptoms worsen or if the condition fails to improve as anticipated.  I provided 15 minutes of non-face-to-face time during this encounter.   [name], MD    [facility] MD/PA/NP OP Progress Note  04/14/2021 10:04 AM Dakota Snow  MRN:  [id number]  Chief Complaint:  Chief Complaint   Follow-up; Depression    HPI:  This is a follow-up appointment for depression, PTSD and anxiety.  He states that he has been trying to stick to a routine.  He enjoys working on garden.  He notices that he tends to be doing better when he is busy.  Although he used to enjoy the time with his wife when she was during summer vacation, she has a return to work.  Although he feels down about this, and is concerned of financial strain and employment,  he states that his mood has been in the middle since up titration of duloxetine.  He has insomnia for the past few months.  He has snoring, and feels fatigued at times.  He partly attributes this to anxiety, although it has been getting better compared to before.  He has not taken Valium for a while.  He has not drunk alcohol except 1 time since the last visit.  He denies  panic attacks.  Although he reports occasional passive SI, he denies any intent or plan.  He feels comfortable to stay on duloxetine at the current dose.    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 Household: wife, his daughter Marital status: married Number of children: 2 (his son deceased in 23-Nov-2016by suicide)   Visit Diagnosis:    ICD-10-CM   1. PTSD (post-traumatic stress disorder)  F43.10     2. MDD (major depressive disorder), recurrent episode, mild (HCC)  F33.0     3. Insomnia, unspecified type  G47.00 Ambulatory referral to Neurology    4. Anxiety state  F41.1       Past Psychiatric History: Please see initial evaluation for full details. I have reviewed the history. No updates at this time.     Past Medical History:  Past Medical History:  Diagnosis Date   Anxiety    Arthritis    Depression    watched son committ suicide   GERD (gastroesophageal reflux disease)    Hypertension    no meds   PONV (postoperative nausea and vomiting)    HAD SOME ISSUES WITH NAUSEA.- THINKS ITS MEDICATION RELATED, NOT ANESTHESIA    Past Surgical History:  Procedure Laterality Date   APPLICATION OF ROBOTIC ASSISTANCE FOR SPINAL PROCEDURE N/A 02/19/2018  Procedure: APPLICATION OF ROBOTIC ASSISTANCE FOR SPINAL PROCEDURE;  Surgeon: Barnett Abu, MD;  Location: [facility] OR;  Service: Neurosurgery;  Laterality: N/A;   BACK SURGERY     X 2    HAND SURGERY     NECK SURGERY      Family Psychiatric History: Please see initial evaluation for full details. I have reviewed the history. No updates at this time.     Family History:  Family History  Problem Relation Age of Onset   Anxiety disorder Mother    Depression Father     Social History:  Social History   Socioeconomic History   Marital status: Married    Spouse name: Not on file   Number of children: Not on file    Years of education: Not on file   Highest education level: Not on file  Occupational History   Not on file  Tobacco Use   Smoking status: Former    Years: 1.00    Types: Cigarettes    Quit date: 07/09/1988    Years since quitting: 32.7   Smokeless tobacco: Former    Types: Chew    Quit date: 11/20/2017  Vaping Use   Vaping Use: Never used  Substance and Sexual Activity   Alcohol use: Not Currently    Comment: occ   Drug use: Not on file    Comment: CBD OINTMENT ON BACK   Sexual activity: Yes    Birth control/protection: Implant  Other Topics Concern   Not on file  Social History Narrative   Not on file   Social Determinants of Health   Financial Resource Strain: Not on file  Food Insecurity: Not on file  Transportation Needs: Not on file  Physical Activity: Not on file  Stress: Not on file  Social Connections: Not on file    Allergies: No Known Allergies  Metabolic Disorder Labs: No results found for: HGBA1C, MPG No results found for: PROLACTIN No results found for: CHOL, TRIG, HDL, CHOLHDL, VLDL, LDLCALC No results found for: TSH  Therapeutic Level Labs: No results found for: LITHIUM No results found for: VALPROATE No components found for:  CBMZ  Current Medications: Current Outpatient Medications  Medication Sig Dispense Refill   acetaminophen (TYLENOL) 500 MG tablet Take 500 mg by mouth daily as needed for moderate pain.     Ca Carbonate-Mag Hydroxide (ROLAIDS ANTACID ULTRA STRENGTH PO) Take 1-2 tablets by mouth at bedtime as needed (indigestion/heartburn).      diazepam (VALIUM) 5 MG tablet TAKE 1/2 TO 1 TABLET BY MOUTH DAILY AS NEEDED FOR ANXIETY 30 tablet 0   [START ON 06/01/2021] DULoxetine (CYMBALTA) 60 MG capsule Take 2 capsules (120 mg total) by mouth daily. 180 capsule 0   fluticasone (FLONASE) 50 MCG/ACT nasal spray Place 1 spray into both nostrils daily.     lisinopril-hydrochlorothiazide (PRINZIDE,ZESTORETIC) 10-12.5 MG tablet Take 1 tablet by  mouth every evening.     No current facility-administered medications for this visit.     Musculoskeletal: Strength & Muscle Tone:  N/A Gait & Station:  N/A Patient leans: N/A  Psychiatric Specialty Exam: Review of Systems  Psychiatric/Behavioral:  Positive for sleep disturbance. Negative for agitation, behavioral problems, confusion, decreased concentration, dysphoric mood, hallucinations, self-injury and suicidal ideas. The patient is nervous/anxious. The patient is not hyperactive.   All other systems reviewed and are negative.  There were no vitals taken for this visit.There is no height or weight on file to calculate BMI.  General Appearance: Fairly Groomed  Eye Contact:  Good  Speech:  Clear and Coherent  Volume:  Normal  Mood:   in the middle  Affect:  Appropriate, Congruent, and calm  Thought Process:  Coherent  Orientation:  Full (Time, Place, and Person)  Thought Content: Logical   Suicidal Thoughts:  No  Homicidal Thoughts:  No  Memory:  Immediate;   Good  Judgement:  Good  Insight:  Good  Psychomotor Activity:  Normal  Concentration:  Concentration: Good and Attention Span: Good  Recall:  Good  Fund of Knowledge: Good  Language: Good  Akathisia:  No  Handed:  Right  AIMS (if indicated): not done  Assets:  Communication Skills Desire for Improvement  ADL's:  Intact  Cognition: WNL  Sleep:  Poor   Screenings: Oceanographer Row Counselor from 09/04/2018 in [facility]  PHQ-2 Total Score 4  PHQ-9 Total Score 21        Assessment and Plan:  BRNADON EOFF is a 55 y.o. year old male with a history of Depression, PTSD, spondylolisthesis  L4-L5, lumbar radiculopathy, who presents for follow up appointment for below.   1. PTSD (post-traumatic stress disorder) 2. MDD (major depressive disorder), recurrent episode, mild (HCC) # Anxiety state There has been overall improvement in depressive symptoms and  anxiety since up titration of duloxetine.  Psychosocial stressors includes complicated grief of loss of his son by suicide in November 2016.  Other psychosocial stressors includes back pain and financial strain.  Will continue current dose to target PTSD and depression.  Will continue Valium as needed for anxiety.    # Insomnia He reports middle insomnia, snoring and occasional fatigue.  Will make referral for evaluation of sleep apnea.   # Alcohol use  Improving. He tends to have occasional alcohol use as coping skills. He is not interested in pharmacological option at this time.  Will continue to monitor.    Plan  I have reviewed and updated plans as below  1. Continue duloxetine 120 mg daily  2. Continue valium  2.5 mg - 5 mg daily as needed for anxiety  (he declined refill) 3. Referral for evaluation of sleep apnea 3. Next appointment: 10/5 at 9 AM for 30 mins, video - he would like to hold therapy until he is able to see in person  Referral to sleep apnea,    Past trials of medication: duloxetine, valium (may have tried sertraline in the past, which made him feel weird)     The patient demonstrates the following risk factors for suicide: Chronic risk factors for suicide include: psychiatric disorder of depression and completed suicide in a family member. Acute risk factors for suicide include: unemployment, loss (financial, interpersonal, professional) and recent discharge from inpatient psychiatry. Protective factors for this patient include: positive social support, responsibility to others (children, family), coping skills and hope for the future. Considering these factors, the overall suicide risk at this point appears to be low. Patient is appropriate for outpatient follow up. Guns were removed by his wife.          [name], MD 04/14/2021, 10:04 AM

## 2021-04-14 ENCOUNTER — Telehealth (INDEPENDENT_AMBULATORY_CARE_PROVIDER_SITE_OTHER): Payer: Medicare PPO | Admitting: Psychiatry

## 2021-04-14 ENCOUNTER — Other Ambulatory Visit: Payer: Self-pay

## 2021-04-14 ENCOUNTER — Encounter: Payer: Self-pay | Admitting: Psychiatry

## 2021-04-14 DIAGNOSIS — F411 Generalized anxiety disorder: Secondary | ICD-10-CM | POA: Diagnosis not present

## 2021-04-14 DIAGNOSIS — F330 Major depressive disorder, recurrent, mild: Secondary | ICD-10-CM

## 2021-04-14 DIAGNOSIS — G4700 Insomnia, unspecified: Secondary | ICD-10-CM

## 2021-04-14 DIAGNOSIS — F4310 Post-traumatic stress disorder, unspecified: Secondary | ICD-10-CM | POA: Diagnosis not present

## 2021-04-14 MED ORDER — DULOXETINE HCL 60 MG PO CPEP: 120.0000 mg | ORAL_CAPSULE | Freq: Every day | ORAL | 0 refills | Status: DC

## 2021-04-14 NOTE — Patient Instructions (Signed)
1. Continue duloxetine 120 mg daily  2. Continue valium  2.5 mg - 5 mg daily as needed for anxiety   3. Next appointment: 10/5 at 9 AM f

## 2021-06-08 NOTE — Progress Notes (Signed)
Virtual Visit via Video Note  I connected with Dakota Snow on 06/09/21 at  9:00 AM EDT by a video enabled telemedicine application and verified that I am speaking with the correct person using two identifiers.  Location: Patient: home Provider: office Persons participated in the visit- patient, provider    I discussed the limitations of evaluation and management by telemedicine and the availability of in person appointments. The patient expressed understanding and agreed to proceed.      I discussed the assessment and treatment plan with the patient. The patient was provided an opportunity to ask questions and all were answered. The patient agreed with the plan and demonstrated an understanding of the instructions.   The patient was advised to call back or seek an in-person evaluation if the symptoms worsen or if the condition fails to improve as anticipated.  I provided 23 minutes of non-face-to-face time during this encounter.   [name], MD    [facility] MD/PA/NP OP Progress Note  06/09/2021 9:39 AM Dakota Snow  MRN:  [id number]  Chief Complaint:  Chief Complaint   Follow-up; Trauma; Depression    HPI:  This is a follow-up appointment for depression and PTSD.  He states that he has been working on a house project.  It has been frustrating at times that it takes time for him to do things due to pain.  He has been feeling more sour/grumpy.  He partly attributes it to do this work.  He also reports conflict with his wife at times.  He is unsure what is going on with her, but thinks that she has been depressed over the summer.  He agrees to make change in his communication style so that he can have more quality time with her.  He misses his daughter.  She and her boyfriend bought an Radio producer for holiday, and he is looking forward to it.  He has occasional insomnia.  He decided not to pursue sleep evaluation at this time due to financial strain.  He has slight decrease in  appetite, although he denies change in weight.  He has fair energy.  Although he has occasional passive SI, he adamantly denies any intent as he thinks of his daughter.  He believes his anxiety has been improving, and he denies any panic attacks.  He drank a bottle of wine on weekend with his wife.  He denies any craving for alcohol.  He has not taken any Valium for anxiety since the last visit.     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 Household: wife, his daughter Marital status: married Number of children: 2 (his son deceased in 2016/11/24by suicide)  Visit Diagnosis:    ICD-10-CM   1. PTSD (post-traumatic stress disorder)  F43.10     2. MDD (major depressive disorder), recurrent episode, mild (HCC)  F33.0     3. Anxiety state  F41.1       Past Psychiatric History: Please see initial evaluation for full details. I have reviewed the history. No updates at this time.     Past Medical History:  Past Medical History:  Diagnosis Date   Anxiety    Arthritis    Depression    watched son committ suicide   GERD (gastroesophageal reflux disease)    Hypertension    no meds   PONV (postoperative  nausea and vomiting)    HAD SOME ISSUES WITH NAUSEA.- THINKS ITS MEDICATION RELATED, NOT ANESTHESIA    Past Surgical History:  Procedure Laterality Date   APPLICATION OF ROBOTIC ASSISTANCE FOR SPINAL PROCEDURE N/A 02/19/2018   Procedure: APPLICATION OF ROBOTIC ASSISTANCE FOR SPINAL PROCEDURE;  Surgeon: Barnett Abu, MD;  Location: [facility] OR;  Service: Neurosurgery;  Laterality: N/A;   BACK SURGERY     X 2    HAND SURGERY     NECK SURGERY      Family Psychiatric History: Please see initial evaluation for full details. I have reviewed the history. No updates at this time.     Family History:  Family History  Problem Relation Age of Onset   Anxiety disorder Mother     Depression Father     Social History:  Social History   Socioeconomic History   Marital status: Married    Spouse name: Not on file   Number of children: Not on file   Years of education: Not on file   Highest education level: Not on file  Occupational History   Not on file  Tobacco Use   Smoking status: Former    Years: 1.00    Types: Cigarettes    Quit date: 07/09/1988    Years since quitting: 32.9   Smokeless tobacco: Former    Types: Chew    Quit date: 11/20/2017  Vaping Use   Vaping Use: Never used  Substance and Sexual Activity   Alcohol use: Not Currently    Comment: occ   Drug use: Not on file    Comment: CBD OINTMENT ON BACK   Sexual activity: Yes    Birth control/protection: Implant  Other Topics Concern   Not on file  Social History Narrative   Not on file   Social Determinants of Health   Financial Resource Strain: Not on file  Food Insecurity: Not on file  Transportation Needs: Not on file  Physical Activity: Not on file  Stress: Not on file  Social Connections: Not on file    Allergies: No Known Allergies  Metabolic Disorder Labs: No results found for: HGBA1C, MPG No results found for: PROLACTIN No results found for: CHOL, TRIG, HDL, CHOLHDL, VLDL, LDLCALC No results found for: TSH  Therapeutic Level Labs: No results found for: LITHIUM No results found for: VALPROATE No components found for:  CBMZ  Current Medications: Current Outpatient Medications  Medication Sig Dispense Refill   acetaminophen (TYLENOL) 500 MG tablet Take 500 mg by mouth daily as needed for moderate pain.     Ca Carbonate-Mag Hydroxide (ROLAIDS ANTACID ULTRA STRENGTH PO) Take 1-2 tablets by mouth at bedtime as needed (indigestion/heartburn).      diazepam (VALIUM) 5 MG tablet TAKE 1/2 TO 1 TABLET BY MOUTH DAILY AS NEEDED FOR ANXIETY 30 tablet 0   DULoxetine (CYMBALTA) 60 MG capsule Take 2 capsules (120 mg total) by mouth daily. 180 capsule 0   fluticasone (FLONASE) 50  MCG/ACT nasal spray Place 1 spray into both nostrils daily.     lisinopril-hydrochlorothiazide (PRINZIDE,ZESTORETIC) 10-12.5 MG tablet Take 1 tablet by mouth every evening.     No current facility-administered medications for this visit.     Musculoskeletal: Strength & Muscle Tone:  N/A Gait & Station:  N/A Patient leans: N/A  Psychiatric Specialty Exam: Review of Systems  Psychiatric/Behavioral:  Positive for sleep disturbance. Negative for agitation, behavioral problems, confusion, decreased concentration, dysphoric mood, hallucinations, self-injury and suicidal ideas. The patient is not nervous/anxious  and is not hyperactive.   All other systems reviewed and are negative.  There were no vitals taken for this visit.There is no height or weight on file to calculate BMI.  General Appearance: Fairly Groomed  Eye Contact:  Good  Speech:  Clear and Coherent  Volume:  Normal  Mood:   "more sour"  Affect:  Appropriate, Congruent, and calm, down at times  Thought Process:  Coherent  Orientation:  Full (Time, Place, and Person)  Thought Content: Logical   Suicidal Thoughts:  Yes.  without intent/plan  Homicidal Thoughts:  No  Memory:  Immediate;   Good  Judgement:  Good  Insight:  Good  Psychomotor Activity:  Normal  Concentration:  Concentration: Good and Attention Span: Good  Recall:  Good  Fund of Knowledge: Good  Language: Good  Akathisia:  No  Handed:  Right  AIMS (if indicated): not done  Assets:  Communication Skills Desire for Improvement  ADL's:  Intact  Cognition: WNL  Sleep:  Fair   Screenings: Insurance account manager from 09/04/2018 in [facility]  PHQ-2 Total Score 4  PHQ-9 Total Score 21        Assessment and Plan:  Dakota Snow is a 55 y.o. year old male with a history of Depression, PTSD, spondylolisthesis  L4-L5, lumbar radiculopathy, who presents for follow up appointment for below.    1.  PTSD (post-traumatic stress disorder) 2. MDD (major depressive disorder), recurrent episode, mild (HCC) 3. Anxiety state Although he reports slight worsening in his mood symptoms, he has been handling things relatively well.  Psychosocial stressors includes complicated grief of loss of his son by suicide in November 2016.  Other psychosocial stressors includes back pain,  financial strain, and his daughter who have left the home.  Will continue current dose of duloxetine to target PTSD, depression and anxiety.  Will continue Valium as needed for anxiety.   # Insomnia Improving. He reports middle insomnia, snoring and occasional fatigue.  Although referral was made for sleep evaluation, he would like to hold this due to financial strain.    # Alcohol use  Improving.  He tends to have occasional alcohol use as coping skills. He is not interested in pharmacological option at this time.  Will continue to monitor.    Plan  I have reviewed and updated plans as below  1. Continue duloxetine 120 mg daily  2. Continue valium  2.5 mg - 5 mg daily as needed for anxiety  (he declined refill) 3. Next appointment: 12/7 at 9 AM for 30 mins, video - he would like to hold therapy until he is able to see in person   Referral to sleep apnea,    Past trials of medication: duloxetine, valium (may have tried sertraline in the past, which made him feel weird)     The patient demonstrates the following risk factors for suicide: Chronic risk factors for suicide include: psychiatric disorder of depression and completed suicide in a family member. Acute risk factors for suicide include: unemployment, loss (financial, interpersonal, professional) and recent discharge from inpatient psychiatry. Protective factors for this patient include: positive social support, responsibility to others (children, family), coping skills and hope for the future. Considering these factors, the overall suicide risk at this point appears to  be low. Patient is appropriate for outpatient follow up. Guns were removed by his wife.       [name], MD 06/09/2021, 9:39 AM

## 2021-06-09 ENCOUNTER — Other Ambulatory Visit: Payer: Self-pay

## 2021-06-09 ENCOUNTER — Encounter: Payer: Self-pay | Admitting: Psychiatry

## 2021-06-09 ENCOUNTER — Telehealth (INDEPENDENT_AMBULATORY_CARE_PROVIDER_SITE_OTHER): Payer: Medicare PPO | Admitting: Psychiatry

## 2021-06-09 DIAGNOSIS — F411 Generalized anxiety disorder: Secondary | ICD-10-CM | POA: Diagnosis not present

## 2021-06-09 DIAGNOSIS — F330 Major depressive disorder, recurrent, mild: Secondary | ICD-10-CM

## 2021-06-09 DIAGNOSIS — F4310 Post-traumatic stress disorder, unspecified: Secondary | ICD-10-CM | POA: Diagnosis not present

## 2021-06-09 NOTE — Patient Instructions (Signed)
1. Continue duloxetine 120 mg daily  2. Continue valium  2.5 mg - 5 mg daily as needed for anxiety  3. Next appointment: 12/7 at 9 AM

## 2021-07-22 ENCOUNTER — Other Ambulatory Visit: Payer: Self-pay | Admitting: Psychiatry

## 2021-07-22 ENCOUNTER — Telehealth: Payer: Self-pay

## 2021-07-22 MED ORDER — DIAZEPAM 5 MG PO TABS: ORAL_TABLET | ORAL | 0 refills | Status: AC

## 2021-07-22 NOTE — Telephone Encounter (Signed)
Ordered

## 2021-07-22 NOTE — Telephone Encounter (Signed)
pt left a message that he needs a refill on the diazepam

## 2021-08-09 NOTE — Progress Notes (Signed)
Virtual Visit via Video Note  I connected with [name] on 08/11/21 at  9:00 AM EST by a video enabled telemedicine application and verified that I am speaking with the correct person using two identifiers.  Location: Patient: home Provider: office Persons participated in the visit- patient, provider    I discussed the limitations of evaluation and management by telemedicine and the availability of in person appointments. The patient expressed understanding and agreed to proceed.  I discussed the assessment and treatment plan with the patient. The patient was provided an opportunity to ask questions and all were answered. The patient agreed with the plan and demonstrated an understanding of the instructions.   The patient was advised to call back or seek an in-person evaluation if the symptoms worsen or if the condition fails to improve as anticipated.  I provided 26 minutes of non-face-to-face time during this encounter.   [name], MD    [facility] MD/PA/NP OP Progress Note  08/11/2021 9:39 AM KEO SCHIRMER  MRN:  [id number]  Chief Complaint:  Chief Complaint   Follow-up; Trauma; Depression    HPI:  This is a follow-up appointment for depression and the PTSD.  He states that he was not doing well especially for the past few weeks.  He was in significant pain, and could not do anything.  He also states that there was an anniversary of loss of his son.  He agrees that holiday season is difficult for him.  He was feeling hopeless, and had SI, although he denies any intent or plan.  He reports great support from his family, his brother for visited the patient, and his friend, who also lost his child several years ago.  He agrees to keep connection with his family and friends.  He is looking forward to the Christmas as his daughter will be visiting him.  He believes his mood has been better for the past week, and denies any SI at this time.  He feels scared to try other medication  as his hope was to come off the medication.  However, after provided psychoeducation, he agrees to consider adding medication in the future when he feels comfortable.  He has insomnia.  He feels fatigue.  He has difficulty in concentration.  He has slightly decrease in appetite.  He denies SI.  He was feeling anxious and had intense anxiety.  Although he was taking Valium almost every day, he has not taken it for the past week.  He denies alcohol use or drug use. Of note, he was on steroid at least for a few weeks for his back pain.   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 Household: wife, his daughter Marital status: married Number of children: 2 (his son deceased in 12/21/16by suicide)   Visit Diagnosis:    ICD-10-CM   1. PTSD (post-traumatic stress disorder)  F43.10     2. MDD (major depressive disorder), recurrent episode, moderate (HCC)  F33.1     3. 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 I have reviewed the history. No updates at this time.     Past Medical History:  Past Medical History:  Diagnosis Date   Anxiety    Arthritis  Depression    watched son committ suicide   GERD (gastroesophageal reflux disease)    Hypertension    no meds   PONV (postoperative nausea and vomiting)    HAD SOME ISSUES WITH NAUSEA.- THINKS ITS MEDICATION RELATED, NOT ANESTHESIA    Past Surgical History:  Procedure Laterality Date   APPLICATION OF ROBOTIC ASSISTANCE FOR SPINAL PROCEDURE N/A 02/19/2018   Procedure: APPLICATION OF ROBOTIC ASSISTANCE FOR SPINAL PROCEDURE;  Surgeon: Barnett Abu, MD;  Location: [facility] OR;  Service: Neurosurgery;  Laterality: N/A;   BACK SURGERY     X 2    HAND SURGERY     NECK SURGERY      Family Psychiatric History: Please see initial  evaluation for full details. I have reviewed the history. No updates at this time.     Family History:  Family History  Problem Relation Age of Onset   Anxiety disorder Mother    Depression Father     Social History:  Social History   Socioeconomic History   Marital status: Married    Spouse name: Not on file   Number of children: Not on file   Years of education: Not on file   Highest education level: Not on file  Occupational History   Not on file  Tobacco Use   Smoking status: Former    Years: 1.00    Types: Cigarettes    Quit date: 07/09/1988    Years since quitting: 33.1   Smokeless tobacco: Former    Types: Chew    Quit date: 11/20/2017  Vaping Use   Vaping Use: Never used  Substance and Sexual Activity   Alcohol use: Not Currently    Comment: occ   Drug use: Not on file    Comment: CBD OINTMENT ON BACK   Sexual activity: Yes    Birth control/protection: Implant  Other Topics Concern   Not on file  Social History Narrative   Not on file   Social Determinants of Health   Financial Resource Strain: Not on file  Food Insecurity: Not on file  Transportation Needs: Not on file  Physical Activity: Not on file  Stress: Not on file  Social Connections: Not on file    Allergies: No Known Allergies  Metabolic Disorder Labs: No results found for: HGBA1C, MPG No results found for: PROLACTIN No results found for: CHOL, TRIG, HDL, CHOLHDL, VLDL, LDLCALC No results found for: TSH  Therapeutic Level Labs: No results found for: LITHIUM No results found for: VALPROATE No components found for:  CBMZ  Current Medications: Current Outpatient Medications  Medication Sig Dispense Refill   acetaminophen (TYLENOL) 500 MG tablet Take 500 mg by mouth daily as needed for moderate pain.     Ca Carbonate-Mag Hydroxide (ROLAIDS ANTACID ULTRA STRENGTH PO) Take 1-2 tablets by mouth at bedtime as needed (indigestion/heartburn).      diazepam (VALIUM) 5 MG tablet TAKE 1/2 TO  1 TABLET BY MOUTH DAILY AS NEEDED FOR ANXIETY 30 tablet 0   [START ON 08/31/2021] DULoxetine (CYMBALTA) 60 MG capsule Take 2 capsules (120 mg total) by mouth daily. 180 capsule 0   fluticasone (FLONASE) 50 MCG/ACT nasal spray Place 1 spray into both nostrils daily.     lisinopril-hydrochlorothiazide (PRINZIDE,ZESTORETIC) 10-12.5 MG tablet Take 1 tablet by mouth every evening.     No current facility-administered medications for this visit.     Musculoskeletal: Strength & Muscle Tone:  N/A Gait & Station:  N/A Patient leans: N/A  Psychiatric Specialty Exam:  Review of Systems  Psychiatric/Behavioral:  Positive for decreased concentration, dysphoric mood, sleep disturbance and suicidal ideas. Negative for agitation, behavioral problems, confusion, hallucinations and self-injury. The patient is nervous/anxious. The patient is not hyperactive.   All other systems reviewed and are negative.  There were no vitals taken for this visit.There is no height or weight on file to calculate BMI.  General Appearance: Fairly Groomed  Eye Contact:  Good  Speech:  Clear and Coherent  Volume:  Normal  Mood:  Depressed  Affect:  Appropriate, Congruent, and down at times  Thought Process:  Coherent  Orientation:  Full (Time, Place, and Person)  Thought Content: Logical   Suicidal Thoughts:  No  Homicidal Thoughts:  No  Memory:  Immediate;   Good  Judgement:  Good  Insight:  Good  Psychomotor Activity:  Normal  Concentration:  Concentration: Good and Attention Span: Good  Recall:  Good  Fund of Knowledge: Good  Language: Good  Akathisia:  No  Handed:  Right  AIMS (if indicated): not done  Assets:  Communication Skills Desire for Improvement  ADL's:  Intact  Cognition: WNL  Sleep:  Poor   Screenings: Oceanographer Row Counselor from 09/04/2018 in [facility]  PHQ-2 Total Score 4  PHQ-9 Total Score 21        Assessment and Plan:   DUSTYN DANSEREAU is a 56 y.o. year old male with a history of  Depression, PTSD, spondylolisthesis  L4-L5, lumbar radiculopathy, who presents for follow up appointment for below.   1. PTSD (post-traumatic stress disorder) 2. MDD (major depressive disorder), recurrent episode, moderate (HCC) 3. Anxiety state 4. Prolonged grief disorder There has been worsening in depressive, PTSD symptoms and an anxiety in the context of worsening in pain/prescription of steroid, anniversary of loss of his son, and holiday season.  Psychosocial stressors includes grief of loss of his son by suicide in November 2016, financial strain.  Although it was recommended for adjunctive treatment for depression such as bupropion (given he has a concern of weight gain), he would like to hold any change at this time.  Will continue duloxetine to target PTSD, depression and anxiety.  Will continue Valium as needed for anxiety.  He would like to hold therapy until it will be in person visit.    # Insomnia Worsening in relate to the condition as above. He reports middle insomnia, snoring and occasional fatigue.  Although referral was made for sleep evaluation, he would like to hold this due to financial strain.    # Alcohol use  He denies any alcohol use since the last visit.  He tends to have occasional alcohol use as coping skills. He is not interested in pharmacological option at this time.  Will continue to monitor.    Plan   1. Continue duloxetine 120 mg daily  2. Continue valium  2.5 mg - 5 mg daily as needed for anxiety  (he declined refill) 3. Next appointment: 1/10 at 11:30 for 30 mins, video - he would like to hold therapy until he is able to see in person     Past trials of medication: duloxetine, valium (may have tried sertraline in the past, which made him feel weird)     The patient demonstrates the following risk factors for suicide: Chronic risk factors for suicide include: psychiatric disorder of  depression and completed suicide in a family member. Acute risk factors for suicide include: unemployment, loss (financial, interpersonal,  professional) and recent discharge from inpatient psychiatry. Protective factors for this patient include: positive social support, responsibility to others (children, family), coping skills and hope for the future. Considering these factors, the overall suicide risk at this point appears to be low. Patient is appropriate for outpatient follow up. Guns were removed by his wife.          [name], MD 08/11/2021, 9:39 AM

## 2021-08-11 ENCOUNTER — Encounter: Payer: Self-pay | Admitting: Psychiatry

## 2021-08-11 ENCOUNTER — Other Ambulatory Visit: Payer: Self-pay

## 2021-08-11 ENCOUNTER — Telehealth (INDEPENDENT_AMBULATORY_CARE_PROVIDER_SITE_OTHER): Payer: Medicare PPO | Admitting: Psychiatry

## 2021-08-11 DIAGNOSIS — F4310 Post-traumatic stress disorder, unspecified: Secondary | ICD-10-CM

## 2021-08-11 DIAGNOSIS — F331 Major depressive disorder, recurrent, moderate: Secondary | ICD-10-CM

## 2021-08-11 DIAGNOSIS — F411 Generalized anxiety disorder: Secondary | ICD-10-CM | POA: Diagnosis not present

## 2021-08-11 DIAGNOSIS — F4381 Prolonged grief disorder: Secondary | ICD-10-CM

## 2021-08-11 MED ORDER — DULOXETINE HCL 60 MG PO CPEP: 120.0000 mg | ORAL_CAPSULE | Freq: Every day | ORAL | 0 refills | Status: DC

## 2021-08-11 NOTE — Patient Instructions (Signed)
1. Continue duloxetine 120 mg daily  2. Continue valium  2.5 mg - 5 mg daily as needed for anxiety  3. Next appointment: 1/10 at 11:30

## 2021-09-11 NOTE — Progress Notes (Signed)
Virtual Visit via Video Note  I connected with Dakota Snow on 09/14/21 at 11:30 AM EST by a video enabled telemedicine application and verified that I am speaking with the correct person using two identifiers.  Location: Patient: home Provider: office Persons participated in the visit- patient, provider    I discussed the limitations of evaluation and management by telemedicine and the availability of in person appointments. The patient expressed understanding and agreed to proceed.    I discussed the assessment and treatment plan with the patient. The patient was provided an opportunity to ask questions and all were answered. The patient agreed with the plan and demonstrated an understanding of the instructions.   The patient was advised to call back or seek an in-person evaluation if the symptoms worsen or if the condition fails to improve as anticipated.  I provided 19 minutes of non-face-to-face time during this encounter.   Neysa Hottereina Avanell Banwart, MD    [facility] Progress Note  09/14/2021 12:07 PM Dakota Snow  MRN:  [id number]  Chief Complaint:  Chief Complaint   Follow-up; Depression; Trauma    HPI:  This is a follow-up appointment for PTSD and depression.  He states that he has been doing good.  There were some days he was more emotional.  He occasionally cried, thinking about his son.  However, it has not been as intense compared to before, and he usually feels better afterwards.  He also talks about an episode of him seeing his ex-girlfriend, although he was not sure at that time.  He does not have any anger against her anymore, while he is aware that she still posts things about his son on facebook. He was disappointed that his daughter could not come on Christmas due to weather.  However, there is a plan that she may come in spring, and they might visit her in summer.  He has notices that he has been able to have compassion not only to himself but also for other people  as well. She continues to have neck and back pain, and he has insomnia due to this.  He occasionally feels anxious.  He has been trying to lose his weight.  He has fair energy.  He denies SI.  He denies nightmares.  He has occasional flashback.  He denies hypervigilance.  He denies alcohol use as his also trying to lose weight.  He has not used Valium since the last visit.  He is not interested in adjunctive treatment at this time as he thinks he is doing good now.  He agrees to continue other medication as it is.   Visit Diagnosis:    ICD-10-CM   1. PTSD (post-traumatic stress disorder)  F43.10     2. MDD (major depressive disorder), recurrent episode, mild (HCC)  F33.0     3. 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 I have reviewed the history. No updates at this time.     Past Medical History:  Past Medical History:  Diagnosis Date   Anxiety    Arthritis    Depression    watched son committ suicide   GERD (gastroesophageal reflux disease)    Hypertension    no meds   PONV (postoperative nausea and vomiting)    HAD SOME ISSUES WITH NAUSEA.- THINKS ITS MEDICATION RELATED, NOT ANESTHESIA    Past Surgical  History:  Procedure Laterality Date   APPLICATION OF ROBOTIC ASSISTANCE FOR SPINAL PROCEDURE N/A 02/19/2018   Procedure: APPLICATION OF ROBOTIC ASSISTANCE FOR SPINAL PROCEDURE;  Surgeon: Barnett Abu, MD;  Location: [facility] OR;  Service: Neurosurgery;  Laterality: N/A;   BACK SURGERY     X 2    HAND SURGERY     NECK SURGERY      Family Psychiatric History: Please see initial evaluation for full details. I have reviewed the history. No updates at this time.     Family History:  Family History  Problem Relation Age of Onset   Anxiety disorder Mother    Depression Father     Social History:  Social History   Socioeconomic History   Marital status: Married    Spouse name:  Not on file   Number of children: Not on file   Years of education: Not on file   Highest education level: Not on file  Occupational History   Not on file  Tobacco Use   Smoking status: Former    Years: 1.00    Types: Cigarettes    Quit date: 07/09/1988    Years since quitting: 33.2   Smokeless tobacco: Former    Types: Chew    Quit date: 11/20/2017  Vaping Use   Vaping Use: Never used  Substance and Sexual Activity   Alcohol use: Not Currently    Comment: occ   Drug use: Not on file    Comment: CBD OINTMENT ON BACK   Sexual activity: Yes    Birth control/protection: Implant  Other Topics Concern   Not on file  Social History Narrative   Not on file   Social Determinants of Health   Financial Resource Strain: Not on file  Food Insecurity: Not on file  Transportation Needs: Not on file  Physical Activity: Not on file  Stress: Not on file  Social Connections: Not on file    Allergies: No Known Allergies  Metabolic Disorder Labs: No results found for: HGBA1C, MPG No results found for: PROLACTIN No results found for: CHOL, TRIG, HDL, CHOLHDL, VLDL, LDLCALC No results found for: TSH  Therapeutic Level Labs: No results found for: LITHIUM No results found for: VALPROATE No components found for:  CBMZ  Current Medications: Current Outpatient Medications  Medication Sig Dispense Refill   acetaminophen (TYLENOL) 500 MG tablet Take 500 mg by mouth daily as needed for moderate pain.     Ca Carbonate-Mag Hydroxide (ROLAIDS ANTACID ULTRA STRENGTH PO) Take 1-2 tablets by mouth at bedtime as needed (indigestion/heartburn).      diazepam (VALIUM) 5 MG tablet TAKE 1/2 TO 1 TABLET BY MOUTH DAILY AS NEEDED FOR ANXIETY 30 tablet 0   DULoxetine (CYMBALTA) 60 MG capsule Take 2 capsules (120 mg total) by mouth daily. 180 capsule 0   fluticasone (FLONASE) 50 MCG/ACT nasal spray Place 1 spray into both nostrils daily.     lisinopril-hydrochlorothiazide (PRINZIDE,ZESTORETIC) 10-12.5 MG  tablet Take 1 tablet by mouth every evening.     No current facility-administered medications for this visit.     Musculoskeletal: Strength & Muscle Tone:  N/A Gait & Station:  N/A Patient leans: N/A  Psychiatric Specialty Exam: Review of Systems  Psychiatric/Behavioral:  Positive for dysphoric mood and sleep disturbance. Negative for agitation, behavioral problems, confusion, decreased concentration, hallucinations, self-injury and suicidal ideas. The patient is nervous/anxious. The patient is not hyperactive.   All other systems reviewed and are negative.  There were no vitals taken for this  visit.There is no height or weight on file to calculate BMI.  General Appearance: Fairly Groomed  Eye Contact:  Good  Speech:  Clear and Coherent  Volume:  Normal  Mood:   good  Affect:  Appropriate, Congruent, and calm  Thought Process:  Coherent  Orientation:  Full (Time, Place, and Person)  Thought Content: Logical   Suicidal Thoughts:  No  Homicidal Thoughts:  No  Memory:  Immediate;   Good  Judgement:  Good  Insight:  Good  Psychomotor Activity:  Normal  Concentration:  Concentration: Good and Attention Span: Good  Recall:  Good  Fund of Knowledge: Good  Language: Good  Akathisia:  No  Handed:  Right  AIMS (if indicated): not done  Assets:  Communication Skills Desire for Improvement  ADL's:  Intact  Cognition: WNL  Sleep:  Poor   Screenings: Oceanographer Row Counselor from 09/04/2018 in [facility]  PHQ-2 Total Score 4  PHQ-9 Total Score 21        Assessment and Plan:  Dakota Snow is a 56 y.o. year old male with a history of Depression, PTSD, spondylolisthesis  L4-L5, lumbar radiculopathy, who presents for follow up appointment for below.   1. PTSD (post-traumatic stress disorder) 2. MDD (major depressive disorder), recurrent episode, mild (HCC) 3. Anxiety state 4. Prolonged grief disorder There has been  overall improvement and PTSD, depressive symptoms and anxiety since the last visit.  Psychosocial stressors includes pain, grief of loss of his son by suicide in November 2016, financial strain.  Although it was discussed to try adjunctive treatment for depression such as bupropion given his concern of weight gain, he feels comfortable the way he is now.  Will continue duloxetine to target PTSD, depression and anxiety.  Will continue Valium as needed for anxiety.     # Insomnia Improving. He reports middle insomnia, snoring and occasional fatigue.  Although referral was made for sleep evaluation, he would like to hold this due to financial strain.    # Alcohol use  He denies any alcohol use since the last visit.  He tends to have occasional alcohol use as coping skills. He is not interested in pharmacological option at this time.  Will continue to monitor.    Plan    1. Continue duloxetine 120 mg daily  2. Continue valium  2.5 mg - 5 mg daily as needed for anxiety  (he declined refill) 3. Next appointment: 3/10 at 9 AM for 30 mins, video - he would like to hold therapy until he is able to see in person     Past trials of medication: duloxetine, valium (may have tried sertraline in the past, which made him feel weird)     The patient demonstrates the following risk factors for suicide: Chronic risk factors for suicide include: psychiatric disorder of depression and completed suicide in a family member. Acute risk factors for suicide include: unemployment, loss (financial, interpersonal, professional) and recent discharge from inpatient psychiatry. Protective factors for this patient include: positive social support, responsibility to others (children, family), coping skills and hope for the future. Considering these factors, the overall suicide risk at this point appears to be low. Patient is appropriate for outpatient follow up. Guns were removed by his wife.             [name],  MD 09/14/2021, 12:07 PM

## 2021-09-14 ENCOUNTER — Other Ambulatory Visit: Payer: Self-pay

## 2021-09-14 ENCOUNTER — Telehealth (INDEPENDENT_AMBULATORY_CARE_PROVIDER_SITE_OTHER): Payer: Medicare PPO | Admitting: Psychiatry

## 2021-09-14 ENCOUNTER — Encounter: Payer: Self-pay | Admitting: Psychiatry

## 2021-09-14 DIAGNOSIS — F411 Generalized anxiety disorder: Secondary | ICD-10-CM | POA: Diagnosis not present

## 2021-09-14 DIAGNOSIS — F4310 Post-traumatic stress disorder, unspecified: Secondary | ICD-10-CM | POA: Diagnosis not present

## 2021-09-14 DIAGNOSIS — F4381 Prolonged grief disorder: Secondary | ICD-10-CM

## 2021-09-14 DIAGNOSIS — F330 Major depressive disorder, recurrent, mild: Secondary | ICD-10-CM | POA: Diagnosis not present

## 2021-09-14 NOTE — Patient Instructions (Signed)
1. Continue duloxetine 120 mg daily  2. Continue valium  2.5 mg - 5 mg daily as needed for anxiety   3. Next appointment: 3/10 at 9 AM

## 2021-11-10 NOTE — Progress Notes (Signed)
Virtual Visit via Video Note  I connected with Dakota Snow on 11/12/21 at  9:00 AM EST by a video enabled telemedicine application and verified that I am speaking with the correct person using two identifiers.  Location: Patient: home Provider: office Persons participated in the visit- patient, provider    I discussed the limitations of evaluation and management by telemedicine and the availability of in person appointments. The patient expressed understanding and agreed to proceed.     I discussed the assessment and treatment plan with the patient. The patient was provided an opportunity to ask questions and all were answered. The patient agreed with the plan and demonstrated an understanding of the instructions.   The patient was advised to call back or seek an in-person evaluation if the symptoms worsen or if the condition fails to improve as anticipated.  I provided 21 minutes of non-face-to-face time during this encounter.   [name], MD    [facility] MD/PA/NP OP Progress Note  11/12/2021 9:35 AM Dakota Snow  MRN:  [id number]  Chief Complaint: No chief complaint on file.  HPI:  This is a follow-up appointment for depression, PTSD.  He states that he has been doing good.  He has been working on house works/yard work every day.  He thinks the weather has been helping for him.  He is looking forward for his daughter to visit him in a few weeks.  He feels down at times, missing his son.  The pain is not raw or intense as much compared to before.  Normalized and validated his grief.  He has been sleeping better.  He has fair energy.  He has occasional issues with concentration.  He denies change in appetite.  Although he has occasional passive fleeting SI, he adamantly denies any intent or plan.  He wants to live for his daughter, and also wants to find other reason for him to live.  He denies nightmares.  He occasionally has thoughts of his son, but denies flashback.  He denies  hypervigilance.  Although he feels anxious at times, it has been manageable.  He drank five beers a several weeks ago, and denies any habitual use.  He denies drug use.  He has not used Valium for anxiety since the last visit.  He feels comfortable to stay on his current medication regimen.    Visit Diagnosis:    ICD-10-CM   1. PTSD (post-traumatic stress disorder)  F43.10     2. MDD (major depressive disorder), recurrent episode, mild (HCC)  F33.0     3. 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 I have reviewed the history. No updates at this time.     Past Medical History:  Past Medical History:  Diagnosis Date   Anxiety    Arthritis    Depression    watched son committ suicide   GERD (gastroesophageal reflux disease)    Hypertension    no meds   PONV (postoperative nausea and vomiting)    HAD SOME ISSUES WITH NAUSEA.- THINKS ITS MEDICATION RELATED, NOT ANESTHESIA    Past Surgical History:  Procedure Laterality Date   APPLICATION OF ROBOTIC ASSISTANCE FOR SPINAL PROCEDURE N/A 02/19/2018   Procedure: APPLICATION OF ROBOTIC ASSISTANCE FOR SPINAL PROCEDURE;  Surgeon: Barnett Abu, MD;  Location: [facility] OR;  Service: Neurosurgery;  Laterality: N/A;   BACK SURGERY  X 2    HAND SURGERY     NECK SURGERY      Family Psychiatric History: Please see initial evaluation for full details. I have reviewed the history. No updates at this time.     Family History:  Family History  Problem Relation Age of Onset   Anxiety disorder Mother    Depression Father     Social History:  Social History   Socioeconomic History   Marital status: Married    Spouse name: Not on file   Number of children: Not on file   Years of education: Not on file   Highest education level: Not on file  Occupational History   Not on file  Tobacco Use   Smoking status: Former    Years: 1.00    Types:  Cigarettes    Quit date: 07/09/1988    Years since quitting: 33.3   Smokeless tobacco: Former    Types: Chew    Quit date: 11/20/2017  Vaping Use   Vaping Use: Never used  Substance and Sexual Activity   Alcohol use: Not Currently    Comment: occ   Drug use: Not on file    Comment: CBD OINTMENT ON BACK   Sexual activity: Yes    Birth control/protection: Implant  Other Topics Concern   Not on file  Social History Narrative   Not on file   Social Determinants of Health   Financial Resource Strain: Not on file  Food Insecurity: Not on file  Transportation Needs: Not on file  Physical Activity: Not on file  Stress: Not on file  Social Connections: Not on file    Allergies: No Known Allergies  Metabolic Disorder Labs: No results found for: HGBA1C, MPG No results found for: PROLACTIN No results found for: CHOL, TRIG, HDL, CHOLHDL, VLDL, LDLCALC No results found for: TSH  Therapeutic Level Labs: No results found for: LITHIUM No results found for: VALPROATE No components found for:  CBMZ  Current Medications: Current Outpatient Medications  Medication Sig Dispense Refill   acetaminophen (TYLENOL) 500 MG tablet Take 500 mg by mouth daily as needed for moderate pain.     Ca Carbonate-Mag Hydroxide (ROLAIDS ANTACID ULTRA STRENGTH PO) Take 1-2 tablets by mouth at bedtime as needed (indigestion/heartburn).      diazepam (VALIUM) 5 MG tablet TAKE 1/2 TO 1 TABLET BY MOUTH DAILY AS NEEDED FOR ANXIETY 30 tablet 0   [START ON 11/30/2021] DULoxetine (CYMBALTA) 60 MG capsule Take 2 capsules (120 mg total) by mouth daily. 180 capsule 0   fluticasone (FLONASE) 50 MCG/ACT nasal spray Place 1 spray into both nostrils daily.     lisinopril-hydrochlorothiazide (PRINZIDE,ZESTORETIC) 10-12.5 MG tablet Take 1 tablet by mouth every evening.     No current facility-administered medications for this visit.     Musculoskeletal: Strength & Muscle Tone:  N/A Gait & Station:  N/A Patient leans:  N/A  Psychiatric Specialty Exam: Review of Systems  Psychiatric/Behavioral:  Positive for decreased concentration, dysphoric mood and suicidal ideas. Negative for agitation, behavioral problems, confusion, hallucinations, self-injury and sleep disturbance. The patient is nervous/anxious. The patient is not hyperactive.   All other systems reviewed and are negative.  There were no vitals taken for this visit.There is no height or weight on file to calculate BMI.  General Appearance: Fairly Groomed  Eye Contact:  Good  Speech:  Clear and Coherent  Volume:  Normal  Mood:   good  Affect:  Appropriate, Congruent, and calm  Thought Process:  Coherent  Orientation:  Full (Time, Place, and Person)  Thought Content: Logical   Suicidal Thoughts:  Yes.  without intent/plan  Homicidal Thoughts:  No  Memory:  Immediate;   Good  Judgement:  Good  Insight:  Good  Psychomotor Activity:  Normal  Concentration:  Concentration: Good and Attention Span: Good  Recall:  Good  Fund of Knowledge: Good  Language: Good  Akathisia:  No  Handed:  Right  AIMS (if indicated): not done  Assets:  Communication Skills Desire for Improvement  ADL's:  Intact  Cognition: WNL  Sleep:  Good   Screenings: PHQ2-9    Flowsheet Row Counselor from 09/04/2018 in [facility]  PHQ-2 Total Score 4  PHQ-9 Total Score 21        Assessment and Plan:  Dakota Snow is a 56 y.o. year old male with a history of Depression, PTSD, spondylolisthesis  L4-L5, lumbar radiculopathy, who presents for follow up appointment for below.    1. PTSD (post-traumatic stress disorder) 2. MDD (major depressive disorder), recurrent episode, mild (HCC) 3. Anxiety state 4. Prolonged grief disorder He denies significant mood symptoms except occasional down mood in related grief of loss of his son by suicide in November 2016.  Other psychosocial stressors includes financial strain.  Will  continue current dose of duloxetine to target depression and anxiety.  Will continue Valium as needed for anxiety.   # Insomnia Improving. He reports middle insomnia, snoring and occasional fatigue.  Although referral was previously made for sleep evaluation, he would like to hold this due to financial strain.    # Alcohol use  Improving. He denies any alcohol use since the last visit.  He tends to have occasional alcohol use as coping skills. He is not interested in pharmacological option at this time.  Will continue to monitor.    Plan    1. Continue duloxetine 120 mg daily  2. Continue valium  2.5 mg - 5 mg daily as needed for anxiety  (he declined refill) 3. Next appointment: 5/2 at 10 AM for 30 mins, video - he would like to hold therapy until he is able to see in person     Past trials of medication: duloxetine, valium (may have tried sertraline in the past, which made him feel weird)     The patient demonstrates the following risk factors for suicide: Chronic risk factors for suicide include: psychiatric disorder of depression and completed suicide in a family member. Acute risk factors for suicide include: unemployment, loss (financial, interpersonal, professional) and recent discharge from inpatient psychiatry. Protective factors for this patient include: positive social support, responsibility to others (children, family), coping skills and hope for the future. Considering these factors, the overall suicide risk at this point appears to be low. Patient is appropriate for outpatient follow up. Guns were removed by his wife.      Collaboration of Care: Collaboration of Care: Other N/A  Consent: Patient/Guardian gives verbal consent for treatment and assignment of benefits for services provided during this visit. Patient/Guardian expressed understanding and agreed to proceed.   Discussed with the patient about upcoming regulatory changes in Telehealth. The patient verbalized  understanding that there may change in regulations which requires the patient to come for in person visit for continuity of care after May 11 th.    [name], MD 11/12/2021, 9:35 AM

## 2021-11-12 ENCOUNTER — Encounter: Payer: Self-pay | Admitting: Psychiatry

## 2021-11-12 ENCOUNTER — Other Ambulatory Visit: Payer: Self-pay

## 2021-11-12 ENCOUNTER — Telehealth (INDEPENDENT_AMBULATORY_CARE_PROVIDER_SITE_OTHER): Payer: Medicare PPO | Admitting: Psychiatry

## 2021-11-12 DIAGNOSIS — F411 Generalized anxiety disorder: Secondary | ICD-10-CM | POA: Diagnosis not present

## 2021-11-12 DIAGNOSIS — F330 Major depressive disorder, recurrent, mild: Secondary | ICD-10-CM | POA: Diagnosis not present

## 2021-11-12 DIAGNOSIS — F4310 Post-traumatic stress disorder, unspecified: Secondary | ICD-10-CM | POA: Diagnosis not present

## 2021-11-12 DIAGNOSIS — F4381 Prolonged grief disorder: Secondary | ICD-10-CM

## 2021-11-12 MED ORDER — DULOXETINE HCL 60 MG PO CPEP: 120.0000 mg | ORAL_CAPSULE | Freq: Every day | ORAL | 0 refills | Status: AC

## 2021-11-12 NOTE — Patient Instructions (Signed)
1. Continue duloxetine 120 mg daily  ?2. Continue valium  2.5 mg - 5 mg daily as needed for anxiety   ?3. Next appointment: 5/2 at 10 AM  ?

## 2022-01-01 NOTE — Progress Notes (Signed)
Virtual Visit via Video Note ? ?I connected with Dakota CoffeeShane D Snow on 01/04/22 at 10:00 AM EDT by a video enabled telemedicine application and verified that I am speaking with the correct person using two identifiers. ? ?Location: ?Patient: home ?Provider: office ?Persons participated in the visit- patient, provider  ?  ?I discussed the limitations of evaluation and management by telemedicine and the availability of in person appointments. The patient expressed understanding and agreed to proceed. ? ?  ?I discussed the assessment and treatment plan with the patient. The patient was provided an opportunity to ask questions and all were answered. The patient agreed with the plan and demonstrated an understanding of the instructions. ?  ?The patient was advised to call back or seek an in-person evaluation if the symptoms worsen or if the condition fails to improve as anticipated. ? ?I provided 22 minutes of non-face-to-face time during this encounter. ? ? ?Neysa Hottereina Mitzy Naron, MD ? ? ? ?[facility] MD/PA/NP OP Progress Note ? ?01/04/2022 11:13 AM ?Dakota Snow  ?MRN:  [id number] ? ?Chief Complaint: No chief complaint on file. ? ?HPI:  ?This is a follow-up appointment for depression, anxiety and PTSD.  ?He states that he has been doing well except he has some abdominal pain for the past week.  His daughter and her boyfriend visited him.  He had a good time with them, and he really liked her boyfriend.  He feels sad since she left.  However, it is not dragging, and he sees the positive aspects. He and his wife have been trying to find a way to see her more often, although they will stay in the area at least for several years.  He enjoys working on the garden.  He has started to take a walk again, although it used to be difficult mentally.  He reports better relationship with his wife.  He has been trying to be more mature.  Although he still thinks about his son, it is not overwhelming as much compared to before.  He has middle insomnia,  which he partly attributes to his pain.  Although he feels sad at times, he denies feeling depressed.  He denies any change in appetite or weight.  He denies SI.  He denies nightmares.  He has occasional flashback of the night.  He denies hypervigilance.  He denies panic attacks.  He has not taken Valium since the last visit.  He is sure that his PCP feels comfortable taking over his medication.  Although he is not interested in seeing a therapist at this time, he agrees to discuss with his PCP regarding available resources if he needs any in the future.  ? ?Visit Diagnosis:  ?  ICD-10-CM   ?1. PTSD (post-traumatic stress disorder)  F43.10   ?  ?2. MDD (major depressive disorder), recurrent episode, mild (HCC)  F33.0   ?  ?3. Anxiety state  F41.1   ?  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  ?5. Insomnia, unspecified type  G47.00   ?  ? ? ?Past Psychiatric History: Please see initial evaluation for full details. I have reviewed the history. No updates at this time.  ?  ? ?Past Medical History:  ?Past Medical History:  ?Diagnosis Date  ? Anxiety   ? Arthritis   ? Depression   ? watched son committ suicide  ? GERD (gastroesophageal reflux disease)   ? Hypertension   ? no meds  ? PONV (postoperative nausea and vomiting)   ?  HAD SOME ISSUES WITH NAUSEA.- THINKS ITS MEDICATION RELATED, NOT ANESTHESIA  ?  ?Past Surgical History:  ?Procedure Laterality Date  ? APPLICATION OF ROBOTIC ASSISTANCE FOR SPINAL PROCEDURE N/A 02/19/2018  ? Procedure: APPLICATION OF ROBOTIC ASSISTANCE FOR SPINAL PROCEDURE;  Surgeon: Barnett Abu, MD;  Location: [facility] OR;  Service: Neurosurgery;  Laterality: N/A;  ? BACK SURGERY    ? X 2   ? HAND SURGERY    ? NECK SURGERY    ? ? ?Family Psychiatric History: Please see initial evaluation for full details. I have reviewed the history. No updates at this time.  ?  ? ?Family History:  ?Family History  ?Problem Relation Age of Onset  ? Anxiety disorder Mother   ? Depression Father   ? ? ?Social History:   ?Social History  ? ?Socioeconomic History  ? Marital status: Married  ?  Spouse name: Not on file  ? Number of children: Not on file  ? Years of education: Not on file  ? Highest education level: Not on file  ?Occupational History  ? Not on file  ?Tobacco Use  ? Smoking status: Former  ?  Years: 1.00  ?  Types: Cigarettes  ?  Quit date: 07/09/1988  ?  Years since quitting: 33.5  ? Smokeless tobacco: Former  ?  Types: Chew  ?  Quit date: 11/20/2017  ?Vaping Use  ? Vaping Use: Never used  ?Substance and Sexual Activity  ? Alcohol use: Not Currently  ?  Comment: occ  ? Drug use: Not on file  ?  Comment: CBD OINTMENT ON BACK  ? Sexual activity: Yes  ?  Birth control/protection: Implant  ?Other Topics Concern  ? Not on file  ?Social History Narrative  ? Not on file  ? ?Social Determinants of Health  ? ?Financial Resource Strain: Not on file  ?Food Insecurity: Not on file  ?Transportation Needs: Not on file  ?Physical Activity: Not on file  ?Stress: Not on file  ?Social Connections: Not on file  ? ? ?Allergies: No Known Allergies ? ?Metabolic Disorder Labs: ?No results found for: HGBA1C, MPG ?No results found for: PROLACTIN ?No results found for: CHOL, TRIG, HDL, CHOLHDL, VLDL, LDLCALC ?No results found for: TSH ? ?Therapeutic Level Labs: ?No results found for: LITHIUM ?No results found for: VALPROATE ?No components found for:  CBMZ ? ?Current Medications: ?Current Outpatient Medications  ?Medication Sig Dispense Refill  ? acetaminophen (TYLENOL) 500 MG tablet Take 500 mg by mouth daily as needed for moderate pain.    ? Ca Carbonate-Mag Hydroxide (ROLAIDS ANTACID ULTRA STRENGTH PO) Take 1-2 tablets by mouth at bedtime as needed (indigestion/heartburn).     ? diazepam (VALIUM) 5 MG tablet TAKE 1/2 TO 1 TABLET BY MOUTH DAILY AS NEEDED FOR ANXIETY 30 tablet 0  ? DULoxetine (CYMBALTA) 60 MG capsule Take 2 capsules (120 mg total) by mouth daily. 180 capsule 0  ? fluticasone (FLONASE) 50 MCG/ACT nasal spray Place 1 spray into  both nostrils daily.    ? lisinopril-hydrochlorothiazide (PRINZIDE,ZESTORETIC) 10-12.5 MG tablet Take 1 tablet by mouth every evening.    ? ?No current facility-administered medications for this visit.  ? ? ? ?Musculoskeletal: ?Strength & Muscle Tone:  N/A ?Gait & Station:  N/A ?Patient leans: N/A ? ?Psychiatric Specialty Exam: ?Review of Systems  ?Psychiatric/Behavioral:  Positive for sleep disturbance. Negative for agitation, behavioral problems, confusion, decreased concentration, dysphoric mood, hallucinations, self-injury and suicidal ideas. The patient is not nervous/anxious and is not hyperactive.   ?  All other systems reviewed and are negative.  ?There were no vitals taken for this visit.There is no height or weight on file to calculate BMI.  ?General Appearance: Fairly Groomed  ?Eye Contact:  Good  ?Speech:  Clear and Coherent  ?Volume:  Normal  ?Mood:   good  ?Affect:  Appropriate, Congruent, and calm  ?Thought Process:  Coherent  ?Orientation:  Full (Time, Place, and Person)  ?Thought Content: Logical   ?Suicidal Thoughts:  No  ?Homicidal Thoughts:  No  ?Memory:  Immediate;   Good  ?Judgement:  Good  ?Insight:  Good  ?Psychomotor Activity:  Normal  ?Concentration:  Concentration: Good and Attention Span: Good  ?Recall:  Good  ?Fund of Knowledge: Good  ?Language: Good  ?Akathisia:  No  ?Handed:  Right  ?AIMS (if indicated): not done  ?Assets:  Communication Skills ?Desire for Improvement  ?ADL's:  Intact  ?Cognition: WNL  ?Sleep:  Poor  ? ?Screenings: ?PHQ2-9   ? ?Flowsheet Row Counselor from 09/04/2018 in [facility]  ?PHQ-2 Total Score 4  ?PHQ-9 Total Score 21  ? ?  ? ? ? ?Assessment and Plan:  ?Dakota Snow is a 56 y.o. year old male with a history of Depression, PTSD, spondylolisthesis  L4-L5, lumbar radiculopathy , who presents for follow up appointment for below.  ? ?1. PTSD (post-traumatic stress disorder) ?2. MDD (major depressive disorder), recurrent  episode, mild (HCC) ?3. Anxiety state ?4. Prolonged grief disorder ?Although he reports occasional sad mood in the context of missing his daughter, and grief of loss of his son by suicide in November 2016,

## 2022-01-04 ENCOUNTER — Telehealth (INDEPENDENT_AMBULATORY_CARE_PROVIDER_SITE_OTHER): Payer: Medicare PPO | Admitting: Psychiatry

## 2022-01-04 ENCOUNTER — Encounter: Payer: Self-pay | Admitting: Psychiatry

## 2022-01-04 DIAGNOSIS — F330 Major depressive disorder, recurrent, mild: Secondary | ICD-10-CM | POA: Diagnosis not present

## 2022-01-04 DIAGNOSIS — F4310 Post-traumatic stress disorder, unspecified: Secondary | ICD-10-CM

## 2022-01-04 DIAGNOSIS — F4381 Prolonged grief disorder: Secondary | ICD-10-CM

## 2022-01-04 DIAGNOSIS — G4700 Insomnia, unspecified: Secondary | ICD-10-CM

## 2022-01-04 DIAGNOSIS — F411 Generalized anxiety disorder: Secondary | ICD-10-CM

## 2022-01-04 NOTE — Patient Instructions (Signed)
1. Continue duloxetine 120 mg daily  ?2. Continue valium  2.5 mg - 5 mg daily as needed for anxiety  ?3. Next appointment: N/A.  ?
